# Patient Record
Sex: Female | Born: 1970
Health system: Southern US, Community
[De-identification: ages and names within clinical notes are randomized; demographics above are authoritative.]

## PROBLEM LIST (undated history)

## (undated) DIAGNOSIS — E669 Obesity, unspecified: Secondary | ICD-10-CM

## (undated) DIAGNOSIS — H269 Unspecified cataract: Secondary | ICD-10-CM

## (undated) DIAGNOSIS — C499 Malignant neoplasm of connective and soft tissue, unspecified: Secondary | ICD-10-CM

## (undated) HISTORY — DX: Unspecified cataract: H26.9

## (undated) HISTORY — PX: TONSILLECTOMY: SHX5217

## (undated) HISTORY — DX: Malignant neoplasm of connective and soft tissue, unspecified: C49.9

## (undated) HISTORY — PX: COLONOSCOPY: SHX174

## (undated) HISTORY — PX: ENDOMETRIAL ABLATION W/ NOVASURE: SUR434

## (undated) HISTORY — PX: COMBINED HYSTEROSCOPY DIAGNOSTIC / D&C: SUR297

## (undated) HISTORY — DX: Obesity, unspecified: E66.9

## (undated) HISTORY — PX: RADICAL ABDOMINAL HYSTERECTOMY: SUR659

---

## 2008-10-22 HISTORY — PX: AXILLARY ABCESS IRRIGATION AND DEBRIDEMENT: SHX1210

## 2019-03-20 DIAGNOSIS — Z6841 Body Mass Index (BMI) 40.0 and over, adult: Secondary | ICD-10-CM | POA: Insufficient documentation

## 2020-07-18 DIAGNOSIS — R1900 Intra-abdominal and pelvic swelling, mass and lump, unspecified site: Secondary | ICD-10-CM | POA: Insufficient documentation

## 2020-09-03 DIAGNOSIS — C549 Malignant neoplasm of corpus uteri, unspecified: Secondary | ICD-10-CM | POA: Insufficient documentation

## 2020-11-13 ENCOUNTER — Telehealth: Payer: Self-pay | Admitting: *Deleted

## 2020-11-13 NOTE — Telephone Encounter (Signed)
Late entry----called the patient yesterday to schedule a new patient appt. Patient requested an afternoon appt. Dr Berline Lopes ok to schedule in the afternoon.   Called the patient and left a message to call the office back for an appt

## 2020-11-26 ENCOUNTER — Encounter: Payer: Self-pay | Admitting: Gynecologic Oncology

## 2020-11-28 ENCOUNTER — Inpatient Hospital Stay: Payer: Self-pay | Admitting: Gynecologic Oncology

## 2020-12-05 ENCOUNTER — Encounter: Payer: Self-pay | Admitting: Gynecologic Oncology

## 2020-12-08 ENCOUNTER — Encounter: Payer: Self-pay | Admitting: Gynecologic Oncology

## 2020-12-08 ENCOUNTER — Other Ambulatory Visit: Payer: Self-pay

## 2020-12-08 ENCOUNTER — Inpatient Hospital Stay: Payer: 59 | Attending: Gynecologic Oncology | Admitting: Gynecologic Oncology

## 2020-12-08 VITALS — BP 174/97 | HR 85 | Temp 98.5°F | Resp 18 | Ht 66.0 in | Wt 238.0 lb

## 2020-12-08 DIAGNOSIS — Z90722 Acquired absence of ovaries, bilateral: Secondary | ICD-10-CM | POA: Diagnosis not present

## 2020-12-08 DIAGNOSIS — Z6838 Body mass index (BMI) 38.0-38.9, adult: Secondary | ICD-10-CM | POA: Diagnosis not present

## 2020-12-08 DIAGNOSIS — C55 Malignant neoplasm of uterus, part unspecified: Secondary | ICD-10-CM | POA: Insufficient documentation

## 2020-12-08 DIAGNOSIS — R911 Solitary pulmonary nodule: Secondary | ICD-10-CM | POA: Insufficient documentation

## 2020-12-08 DIAGNOSIS — A630 Anogenital (venereal) warts: Secondary | ICD-10-CM | POA: Diagnosis not present

## 2020-12-08 DIAGNOSIS — C499 Malignant neoplasm of connective and soft tissue, unspecified: Secondary | ICD-10-CM

## 2020-12-08 DIAGNOSIS — Z90710 Acquired absence of both cervix and uterus: Secondary | ICD-10-CM | POA: Diagnosis not present

## 2020-12-08 DIAGNOSIS — E669 Obesity, unspecified: Secondary | ICD-10-CM | POA: Diagnosis not present

## 2020-12-08 NOTE — Patient Instructions (Signed)
It was a pleasure to meet you today!  Please give Tina Decker a call when she know you are scheduled for August.  We will get your CT scan scheduled on a day that you do not have to work.  I will see you back for a checkup in the office in 3 months.  If you develop any new or concerning symptoms before then, please call the clinic at [phone] to see me sooner.

## 2020-12-08 NOTE — Progress Notes (Signed)
GYNECOLOGIC ONCOLOGY NEW PATIENT CONSULTATION   Patient Name: Tina Decker  Patient Age: 50 y.o. Date of Service: 12/08/20 Referring Provider: Dr. 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  The patient is doing very well and is NED on exam today. We reviewed signs and symptoms that would be concerning for disease recurrence that should prompt a phone call prior to next scheduled visit.   Per NCCN surveillance recommendations, we discussed continuing with visits every 3-4 months for 2-3 years. Given high risk histology, I recommend CT surveillance imaging. Additionally, the patient had a small pulmonary nodule on her post-op CT chest that requires follow-up. We will plan to get a CT C/A/P in August. The patient will call the office once she knows her August work schedule so that we can facilitate getting her imaging scheduled.   A copy of this note was sent to the patient's referring provider.   60 minutes of total time was spent for this patient encounter, including preparation, face-to-face counseling with the patient and coordination of care, and documentation of the encounter.  Jeral Pinch, MD  Division of Gynecologic Oncology  Department of Obstetrics and Gynecology  [facility]  ___________________________________________  Chief Complaint: Chief Complaint  Patient presents with   Leiomyosarcoma [facility])    History of Present Illness:  Tina Decker is a 50 y.o. y.o. female who is seen in consultation at the request of Dr. Zella Richer for an evaluation of continuation of care in the setting of recently treated leiomyosarcoma of the uterus.  Patient's cancer history is noted below.  She is status post definitive surgery  for stage Ib leiomyosarcoma of the uterus at the end of March this year.  She recovered well from surgery.  Since her last visit with her GYN oncologist in [place] in mid May, patient reports doing well.  She denies any vaginal bleeding or discharge.  She endorses a good appetite without nausea or emesis.  She has put on a little bit of the weight that she lost after her surgery.  She reports normal bowel and bladder function.  She denies any shortness of breath, cough, or chest pain.  Patient moved from [place] to the area because daughter is starting college for forensic pathology at [employer].  The patient will be working at [employer] in the [place].  Treatment History: Oncology History Overview Note  Endometrial ablation in 2017 - no bleeding after until she presented for annual exam in early 2022 and reported episode of heavy vaginal bleeding after COVID booster.  Pap 2022: LSIL, HR HPV+   Leiomyosarcoma of body of uterus ([place])  07/11/2020 Imaging   MRI pelvis: large heterogeneously solid mass measuring 9.7 x 8.1 x 8 cm causing compression to the vagina and cervical effacement. It was described as mostly reflecting the large fibroid. The mass was felt to be emanating from the lower segment ofthe uterus.    08/18/2020 Surgery   Ex lap, modified radical total abdominal hysterectomy, BSO, ureterolysis, lysis of adhesions, and cystoscopy. Dr. Radene Ou, PA  Findings: Upon examination under anesthesia, the external genitalia was normal. Upon bimanual examination, there was a large smooth mass that was filling the vagina to approximately 4 cm from the introitus. It was a smooth surface, approximately 10 cm in size. The  cervix was unable to be palpated secondary to the mass. Upon exploratory laparotomy, the omentum appeared grossly normal. The appendix appeared grossly normal. The bowel evaluated from ileocecal valve to the ligament of Treitz and normal. There were  no peritoneal mesenteric implants. On pelvic survey, the uterine fundus was normal in size. However, there was a very large expansile mass that was emanating from the lower uterine segment, extended into bilateral parametria, sidewall free upon dissection appeared consistent with fibroid. Bilateral fallopian tubes and ovaries were grossly normal. Anterior and posterior cul-de-sac were normal, although they were obliterated by the mass. The specimen was removed en bloc. Photographs were taken. Upon cystoscopic evaluation after closure of the cuff, there was brisk efflux of urine from each ureteral orifice and there was no bladderpathology or injury noted.     08/18/2020 Pathology Results   Stage IB leiomyosarcoma of the uterus Tumor board recommendations: surveillance, CT chest   08/18/2020 Initial Diagnosis   Leiomyosarcoma of body of uterus ([place])   09/10/2020 Imaging   CT chest: Solitary 3 mm right upper lobe pulmonary nodule. Consider follow-up CT chest in 6 months for reassessment.    PAST MEDICAL HISTORY:  Past Medical History:  Diagnosis Date   Cataract    Leiomyosarcoma ([place])    Obesity (BMI 30-39.9)      PAST SURGICAL HISTORY:  Past Surgical History:  Procedure Laterality Date   AXILLARY ABCESS IRRIGATION AND DEBRIDEMENT Left 10/2008   COMBINED HYSTEROSCOPY DIAGNOSTIC / D&C     ENDOMETRIAL ABLATION W/ NOVASURE     RADICAL ABDOMINAL HYSTERECTOMY     TONSILLECTOMY      OB/GYN HISTORY:  OB History  Gravida Para Term Preterm AB Living  1 1          SAB IAB Ectopic Multiple Live Births               # Outcome Date GA Lbr Len/2nd Weight Sex Delivery Anes PTL Lv  1 Para             No LMP recorded.  Age at menarche: 61 Age at menopause: 16 Hx of HRT: Denies Hx of STDs: Denies Last pap: 2022 - LSIL History of abnormal pap smears: Only most recent prior to her cancer diagnosis  SCREENING STUDIES:  Last mammogram: 2022  Last colonoscopy: n/a Last bone mineral  density: n/a  MEDICATIONS: Outpatient Encounter Medications as of 12/08/2020  Medication Sig   loratadine (CLARITIN) 10 MG tablet Take 10 mg by mouth as needed for allergies.   Multiple Vitamin (MULTIVITAMIN) tablet Take 1 tablet by mouth daily.   Turmeric 500 MG CAPS Take 2 capsules by mouth daily.   No facility-administered encounter medications on file as of 12/08/2020.    ALLERGIES:  No Known Allergies   FAMILY HISTORY:  Family History  Problem Relation Age of Onset   Breast cancer Mother    Atrial fibrillation Father    Hyperlipidemia Father      SOCIAL HISTORY:  Social Connections: Not on file    REVIEW OF SYSTEMS:  Pertinent positives include anxiety and depression Denies appetite changes, fevers, chills, fatigue, unexplained weight changes. Denies hearing loss, neck lumps or masses, mouth sores, ringing in ears or voice changes. Denies cough or wheezing.  Denies shortness of breath. Denies chest pain or palpitations. Denies leg swelling. Denies abdominal distention, pain, blood in stools, constipation, diarrhea, nausea, vomiting, or early satiety. Denies pain with intercourse, dysuria, frequency, hematuria or incontinence. Denies hot flashes, pelvic pain,  vaginal bleeding or vaginal discharge.   Denies joint pain, back pain or muscle pain/cramps. Denies itching, rash, or wounds. Denies dizziness, headaches, numbness or seizures. Denies swollen lymph nodes or glands, denies easy bruising or bleeding. Denies confusion, or decreased concentration.  Physical Exam:  Vital Signs for this encounter:  Blood pressure (!) 174/97, pulse 85, temperature 98.5 F (36.9 C), temperature source Tympanic, resp. rate 18, height 5\' 6"  (1.676 m), weight 238 lb (108 kg), SpO2 100 %. Body mass index is 38.41 kg/m. General: Alert, oriented, no acute distress.  HEENT: Normocephalic, atraumatic. Sclera anicteric.  Chest: Clear to auscultation bilaterally. No wheezes, rhonchi, or  rales. Cardiovascular: Regular rate and rhythm, no murmurs, rubs, or gallops.  Abdomen: Obese. Normoactive bowel sounds. Soft, nondistended, nontender to palpation. No masses or hepatosplenomegaly appreciated. No palpable fluid wave.  Well-healed midline laparotomy incision. Extremities: Grossly normal range of motion. Warm, well perfused. No edema bilaterally.  Skin: No rashes or lesions.  Lymphatics: No cervical, supraclavicular, or inguinal adenopathy.  GU:  Normal external female genitalia. No lesions. No discharge or bleeding.             Bladder/urethra:  No lesions or masses, well supported bladder             Vagina: well rugated, no lesions or masses.             Cervix/uterus: Surgically absent.             Adnexa: No masses appreciated.  Rectal: No masses or nodularity.  LABORATORY AND RADIOLOGIC DATA:  Outside medical records were reviewed to synthesize the above history, along with the history and physical obtained during the visit.   No results found for: WBC, HGB, HCT, PLT, GLUCOSE, CHOL, TRIG, HDL, LDLDIRECT, LDLCALC, ALT, AST, NA, K, CL, CREATININE, BUN, CO2, TSH, PSA, INR, GLUF, HGBA1C, MICROALBUR

## 2020-12-22 ENCOUNTER — Ambulatory Visit
Admission: RE | Admit: 2020-12-22 | Discharge: 2020-12-22 | Disposition: A | Discharge status: Home / Self Care | Payer: Self-pay | Source: Ambulatory Visit | Attending: Gynecologic Oncology | Admitting: Gynecologic Oncology

## 2020-12-22 ENCOUNTER — Encounter: Payer: Self-pay | Admitting: Oncology

## 2020-12-22 DIAGNOSIS — C549 Malignant neoplasm of corpus uteri, unspecified: Secondary | ICD-10-CM

## 2020-12-22 NOTE — Progress Notes (Signed)
Imaging CD from [facility] brought to [facility] to be uploaded.

## 2020-12-30 ENCOUNTER — Telehealth: Payer: Self-pay

## 2020-12-30 NOTE — Telephone Encounter (Signed)
Spoke with [facility] and notified her of her appointment on 8/17 at 8am for her CT scan. Instructed her that she needs to be NPO 4 hours prior. She will drink her first bottle of contrast at 6am and her second bottle at 7am. She will coming by the cancer center Thursday 8/11 to pick up the contrast. Patient verbalized understanding.

## 2021-01-07 ENCOUNTER — Encounter (HOSPITAL_COMMUNITY): Payer: Self-pay

## 2021-01-07 ENCOUNTER — Ambulatory Visit (HOSPITAL_COMMUNITY)
Admission: RE | Admit: 2021-01-07 | Discharge: 2021-01-07 | Disposition: A | Discharge status: Home / Self Care | Payer: 59 | Source: Ambulatory Visit | Attending: Gynecologic Oncology | Admitting: Gynecologic Oncology

## 2021-01-07 ENCOUNTER — Other Ambulatory Visit: Payer: Self-pay

## 2021-01-07 DIAGNOSIS — I700 Atherosclerosis of aorta: Secondary | ICD-10-CM | POA: Diagnosis not present

## 2021-01-07 DIAGNOSIS — R918 Other nonspecific abnormal finding of lung field: Secondary | ICD-10-CM | POA: Diagnosis not present

## 2021-01-07 DIAGNOSIS — R911 Solitary pulmonary nodule: Secondary | ICD-10-CM | POA: Diagnosis not present

## 2021-01-07 DIAGNOSIS — C55 Malignant neoplasm of uterus, part unspecified: Secondary | ICD-10-CM | POA: Diagnosis not present

## 2021-01-07 DIAGNOSIS — C499 Malignant neoplasm of connective and soft tissue, unspecified: Secondary | ICD-10-CM | POA: Diagnosis not present

## 2021-01-07 DIAGNOSIS — C539 Malignant neoplasm of cervix uteri, unspecified: Secondary | ICD-10-CM | POA: Diagnosis not present

## 2021-01-07 MED ORDER — IOHEXOL 350 MG/ML SOLN
100.0000 mL | Freq: Once | INTRAVENOUS | Status: AC | PRN
  Administered 2021-01-07: 75 mL via INTRAVENOUS

## 2021-02-09 ENCOUNTER — Telehealth: Payer: Self-pay | Admitting: *Deleted

## 2021-02-09 NOTE — Telephone Encounter (Signed)
Called and moved the patient's appt from 10/21 to 10/24

## 2021-02-10 DIAGNOSIS — Z0000 Encounter for general adult medical examination without abnormal findings: Secondary | ICD-10-CM | POA: Diagnosis not present

## 2021-02-10 DIAGNOSIS — R7309 Other abnormal glucose: Secondary | ICD-10-CM | POA: Diagnosis not present

## 2021-02-10 DIAGNOSIS — I700 Atherosclerosis of aorta: Secondary | ICD-10-CM | POA: Diagnosis not present

## 2021-02-10 DIAGNOSIS — Z13220 Encounter for screening for lipoid disorders: Secondary | ICD-10-CM | POA: Diagnosis not present

## 2021-02-10 DIAGNOSIS — I10 Essential (primary) hypertension: Secondary | ICD-10-CM | POA: Diagnosis not present

## 2021-02-12 DIAGNOSIS — M7918 Myalgia, other site: Secondary | ICD-10-CM | POA: Diagnosis not present

## 2021-02-12 DIAGNOSIS — M9906 Segmental and somatic dysfunction of lower extremity: Secondary | ICD-10-CM | POA: Diagnosis not present

## 2021-02-12 DIAGNOSIS — M546 Pain in thoracic spine: Secondary | ICD-10-CM | POA: Diagnosis not present

## 2021-02-12 DIAGNOSIS — M9902 Segmental and somatic dysfunction of thoracic region: Secondary | ICD-10-CM | POA: Diagnosis not present

## 2021-02-12 DIAGNOSIS — M542 Cervicalgia: Secondary | ICD-10-CM | POA: Diagnosis not present

## 2021-02-12 DIAGNOSIS — M9904 Segmental and somatic dysfunction of sacral region: Secondary | ICD-10-CM | POA: Diagnosis not present

## 2021-02-12 DIAGNOSIS — M9901 Segmental and somatic dysfunction of cervical region: Secondary | ICD-10-CM | POA: Diagnosis not present

## 2021-02-12 DIAGNOSIS — M9903 Segmental and somatic dysfunction of lumbar region: Secondary | ICD-10-CM | POA: Diagnosis not present

## 2021-02-12 DIAGNOSIS — M9905 Segmental and somatic dysfunction of pelvic region: Secondary | ICD-10-CM | POA: Diagnosis not present

## 2021-02-16 ENCOUNTER — Telehealth: Payer: Self-pay | Admitting: *Deleted

## 2021-02-16 NOTE — Telephone Encounter (Signed)
Per request fax records to [facility]

## 2021-02-17 DIAGNOSIS — M9902 Segmental and somatic dysfunction of thoracic region: Secondary | ICD-10-CM | POA: Diagnosis not present

## 2021-02-17 DIAGNOSIS — M9901 Segmental and somatic dysfunction of cervical region: Secondary | ICD-10-CM | POA: Diagnosis not present

## 2021-02-17 DIAGNOSIS — M7918 Myalgia, other site: Secondary | ICD-10-CM | POA: Diagnosis not present

## 2021-02-17 DIAGNOSIS — M546 Pain in thoracic spine: Secondary | ICD-10-CM | POA: Diagnosis not present

## 2021-02-17 DIAGNOSIS — M9904 Segmental and somatic dysfunction of sacral region: Secondary | ICD-10-CM | POA: Diagnosis not present

## 2021-02-17 DIAGNOSIS — M9906 Segmental and somatic dysfunction of lower extremity: Secondary | ICD-10-CM | POA: Diagnosis not present

## 2021-02-17 DIAGNOSIS — M9903 Segmental and somatic dysfunction of lumbar region: Secondary | ICD-10-CM | POA: Diagnosis not present

## 2021-02-17 DIAGNOSIS — M542 Cervicalgia: Secondary | ICD-10-CM | POA: Diagnosis not present

## 2021-02-17 DIAGNOSIS — M9905 Segmental and somatic dysfunction of pelvic region: Secondary | ICD-10-CM | POA: Diagnosis not present

## 2021-02-26 DIAGNOSIS — Z1211 Encounter for screening for malignant neoplasm of colon: Secondary | ICD-10-CM | POA: Diagnosis not present

## 2021-02-26 DIAGNOSIS — E782 Mixed hyperlipidemia: Secondary | ICD-10-CM | POA: Diagnosis not present

## 2021-02-26 DIAGNOSIS — Z0000 Encounter for general adult medical examination without abnormal findings: Secondary | ICD-10-CM | POA: Diagnosis not present

## 2021-02-26 DIAGNOSIS — I700 Atherosclerosis of aorta: Secondary | ICD-10-CM | POA: Diagnosis not present

## 2021-02-26 DIAGNOSIS — C55 Malignant neoplasm of uterus, part unspecified: Secondary | ICD-10-CM | POA: Diagnosis not present

## 2021-02-26 DIAGNOSIS — R911 Solitary pulmonary nodule: Secondary | ICD-10-CM | POA: Diagnosis not present

## 2021-02-26 DIAGNOSIS — I10 Essential (primary) hypertension: Secondary | ICD-10-CM | POA: Diagnosis not present

## 2021-03-03 DIAGNOSIS — M50323 Other cervical disc degeneration at C6-C7 level: Secondary | ICD-10-CM | POA: Diagnosis not present

## 2021-03-03 DIAGNOSIS — M5137 Other intervertebral disc degeneration, lumbosacral region: Secondary | ICD-10-CM | POA: Diagnosis not present

## 2021-03-03 DIAGNOSIS — M9901 Segmental and somatic dysfunction of cervical region: Secondary | ICD-10-CM | POA: Diagnosis not present

## 2021-03-03 DIAGNOSIS — M9903 Segmental and somatic dysfunction of lumbar region: Secondary | ICD-10-CM | POA: Diagnosis not present

## 2021-03-06 ENCOUNTER — Ambulatory Visit: Payer: 59 | Admitting: Gynecologic Oncology

## 2021-03-09 DIAGNOSIS — M47812 Spondylosis without myelopathy or radiculopathy, cervical region: Secondary | ICD-10-CM | POA: Diagnosis not present

## 2021-03-13 ENCOUNTER — Ambulatory Visit: Payer: 59 | Admitting: Gynecologic Oncology

## 2021-03-13 ENCOUNTER — Encounter: Payer: Self-pay | Admitting: Gynecologic Oncology

## 2021-03-16 ENCOUNTER — Other Ambulatory Visit: Payer: Self-pay

## 2021-03-16 ENCOUNTER — Encounter: Payer: Self-pay | Admitting: Gynecologic Oncology

## 2021-03-16 ENCOUNTER — Inpatient Hospital Stay: Payer: 59 | Attending: Gynecologic Oncology | Admitting: Gynecologic Oncology

## 2021-03-16 VITALS — BP 140/85 | HR 76 | Temp 98.1°F | Resp 16 | Ht 66.0 in | Wt 248.0 lb

## 2021-03-16 DIAGNOSIS — C549 Malignant neoplasm of corpus uteri, unspecified: Secondary | ICD-10-CM | POA: Insufficient documentation

## 2021-03-16 DIAGNOSIS — M9901 Segmental and somatic dysfunction of cervical region: Secondary | ICD-10-CM | POA: Diagnosis not present

## 2021-03-16 DIAGNOSIS — C499 Malignant neoplasm of connective and soft tissue, unspecified: Secondary | ICD-10-CM

## 2021-03-16 DIAGNOSIS — M9903 Segmental and somatic dysfunction of lumbar region: Secondary | ICD-10-CM | POA: Diagnosis not present

## 2021-03-16 DIAGNOSIS — Z90710 Acquired absence of both cervix and uterus: Secondary | ICD-10-CM | POA: Insufficient documentation

## 2021-03-16 DIAGNOSIS — M5137 Other intervertebral disc degeneration, lumbosacral region: Secondary | ICD-10-CM | POA: Diagnosis not present

## 2021-03-16 DIAGNOSIS — Z90722 Acquired absence of ovaries, bilateral: Secondary | ICD-10-CM | POA: Diagnosis not present

## 2021-03-16 DIAGNOSIS — M50323 Other cervical disc degeneration at C6-C7 level: Secondary | ICD-10-CM | POA: Diagnosis not present

## 2021-03-16 NOTE — Patient Instructions (Signed)
It was good to see you today!  I do not see or feel any evidence of cancer recurrence on your exam.  We will plan for a follow-up visit in approximately 3 months, at the end of January or early February.  We will plan to also repeat a CT scan on the same day.  Since my schedule is not out past the end of the year, please call back in January to get a visit scheduled with me.  I have asked Sharyn Lull to get your CT scheduled on a Monday either in late January or early February.  If this ends up being on a day that you after work, please call when you know your schedule and we will get the date rearranged.  As always, if you develop any concerning symptoms, such as vaginal bleeding, pelvic pain, or change in your bowel function, before your next scheduled visit, please call to see me sooner.

## 2021-03-16 NOTE — Progress Notes (Signed)
Gynecologic Oncology Return Clinic Visit  03/16/21  Reason for Visit: surveillance visit in the setting of LMS  Treatment History: Oncology History Overview Note  Endometrial ablation in 2017 - no bleeding after until she presented for annual exam in early 2022 and reported episode of heavy vaginal bleeding after COVID booster.  Pap 2022: LSIL, HR HPV+   Leiomyosarcoma of body of uterus ([place])  07/11/2020 Imaging   MRI pelvis: large heterogeneously solid mass measuring 9.7 x 8.1 x 8 cm causing compression to the vagina and cervical effacement. It was described as mostly reflecting the large fibroid. The mass was felt to be emanating from the lower segment ofthe uterus.    08/18/2020 Surgery   Ex lap, modified radical total abdominal hysterectomy, BSO, ureterolysis, lysis of adhesions, and cystoscopy. Dr. Radene Ou, PA  Findings: Upon examination under anesthesia, the external genitalia was normal. Upon bimanual examination, there was a large smooth mass that was filling the vagina to approximately 4 cm from the introitus. It was a smooth surface, approximately 10 cm in size. The cervix was unable to be palpated secondary to the mass. Upon exploratory laparotomy, the omentum appeared grossly normal. The appendix appeared grossly normal. The bowel evaluated from ileocecal valve to the ligament of Treitz and normal. There were no peritoneal mesenteric implants. On pelvic survey, the uterine fundus was normal in size. However, there was a very large expansile mass that was emanating from the lower uterine segment, extended into bilateral parametria, sidewall free upon dissection appeared consistent with fibroid. Bilateral fallopian tubes and ovaries were grossly normal. Anterior and posterior cul-de-sac were normal, although they were obliterated by the mass. The specimen was removed en bloc. Photographs were taken. Upon cystoscopic evaluation after closure of the cuff, there  was brisk efflux of urine from each ureteral orifice and there was no bladderpathology or injury noted.     08/18/2020 Pathology Results   Stage IB leiomyosarcoma of the uterus Tumor board recommendations: surveillance, CT chest   08/18/2020 Initial Diagnosis   Leiomyosarcoma of body of uterus ([place])   09/10/2020 Imaging   CT chest: Solitary 3 mm right upper lobe pulmonary nodule. Consider follow-up CT chest in 6 months for reassessment.     Interval History: The patient presents today for follow-up.  I last saw her in July.  She is now settled in at her job at [employer] in the [place].  She is overall doing very well.  She denies any vaginal bleeding or discharge.  She endorses a good appetite without nausea or emesis.  She notes normal bowel and bladder function.  She denies any abdominal or pelvic pain.  Traveled to [place] this summer.  Past Medical/Surgical History: Past Medical History:  Diagnosis Date   Cataract    Leiomyosarcoma ([place])    Obesity (BMI 30-39.9)     Past Surgical History:  Procedure Laterality Date   AXILLARY ABCESS IRRIGATION AND DEBRIDEMENT Left 10/2008   COMBINED HYSTEROSCOPY DIAGNOSTIC / D&C     ENDOMETRIAL ABLATION W/ NOVASURE     RADICAL ABDOMINAL HYSTERECTOMY     TONSILLECTOMY      Family History  Problem Relation Age of Onset   Breast cancer Mother    Atrial fibrillation Father    Hyperlipidemia Father     Social History   Socioeconomic History   Marital status: Single    Spouse name: Not on file   Number of children: Not on file   Years of education: Not  on file   Highest education level: Not on file  Occupational History   Occupation: OR Nurse    Employer: [employer]  Tobacco Use   Smoking status: Never   Smokeless tobacco: Never  Vaping Use   Vaping Use: Never used  Substance and Sexual Activity   Alcohol use: Yes    Comment: Ocassional   Drug use: Never   Sexual activity: Not Currently  Other Topics Concern   Not on  file  Social History Narrative   Not on file   Social Determinants of Health   Financial Resource Strain: Not on file  Food Insecurity: Not on file  Transportation Needs: Not on file  Physical Activity: Not on file  Stress: Not on file  Social Connections: Not on file    Current Medications:  Current Outpatient Medications:    loratadine (CLARITIN) 10 MG tablet, Take 10 mg by mouth as needed for allergies., Disp: , Rfl:    losartan (COZAAR) 50 MG tablet, Take 50 mg by mouth daily., Disp: , Rfl:    Multiple Vitamin (MULTIVITAMIN) tablet, Take 1 tablet by mouth daily., Disp: , Rfl:    Turmeric 500 MG CAPS, Take 2 capsules by mouth daily., Disp: , Rfl:    fluticasone (FLONASE) 50 MCG/ACT nasal spray, Place 2 sprays into both nostrils 2 (two) times daily as needed. (Patient not taking: Reported on 03/13/2021), Disp: , Rfl:   Review of Systems: Denies appetite changes, fevers, chills, fatigue, unexplained weight changes. Denies hearing loss, neck lumps or masses, mouth sores, ringing in ears or voice changes. Denies cough or wheezing.  Denies shortness of breath. Denies chest pain or palpitations. Denies leg swelling. Denies abdominal distention, pain, blood in stools, constipation, diarrhea, nausea, vomiting, or early satiety. Denies pain with intercourse, dysuria, frequency, hematuria or incontinence. Denies hot flashes, pelvic pain, vaginal bleeding or vaginal discharge.   Denies joint pain, back pain or muscle pain/cramps. Denies itching, rash, or wounds. Denies dizziness, headaches, numbness or seizures. Denies swollen lymph nodes or glands, denies easy bruising or bleeding. Denies anxiety, depression, confusion, or decreased concentration.  Physical Exam: BP 140/85 Comment (BP Location): manual  Pulse 76   Temp 98.1 F (36.7 C) (Oral)   Resp 16   Ht 5\' 6"  (1.676 m)   Wt 248 lb (112.5 kg)   SpO2 99%   BMI 40.03 kg/m  General: Alert, oriented, no acute distress. HEENT:  Normocephalic, atraumatic, sclera anicteric. Chest: Clear to auscultation bilaterally.  No wheezes or rhonchi. Cardiovascular: Regular rate and rhythm, no murmurs. Abdomen: Obese, soft, nontender.  Normoactive bowel sounds.  No masses or hepatosplenomegaly appreciated.  Well-healed incision. Extremities: Grossly normal range of motion.  Warm, well perfused.  No edema bilaterally. Skin: No rashes or lesions noted. Lymphatics: No cervical, supraclavicular, or inguinal adenopathy. GU: Normal appearing external genitalia without erythema, excoriation, or lesions.  Speculum exam reveals well rugated vaginal mucosa, no lesions or masses noted.  Bimanual exam reveals cuff intact, no nodularity or masses.  Rectovaginal exam confirms these findings.  Laboratory & Radiologic Studies: CT C/A/P on 8/17: IMPRESSION: 1. No definitive evidence of metastatic disease. 2. Scattered pulmonary nodules are unchanged from 09/10/2020. Continued attention on follow-up is recommended. 3.  Aortic atherosclerosis (ICD10-I70.0).  Assessment & Plan: Tina Decker is a 50 y.o. 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  Patient continues to do well and is NED on exam.  We reviewed follow-up  CT scan from August that showed no evidence of metastatic disease.  Few pulmonary nodule were stable from prior imaging in April.   Per NCCN surveillance recommendations, we discussed continuing with visits every 3-4 months for 2-3 years. Given high risk histology, I recommend CT surveillance imaging.  We will tentatively plan for CT and neck surveillance visit at the end of January or early February.  CT scan was scheduled today.  Patient will call back when she knows her work schedule closer to the date to change this if needed.  We reviewed signs and symptoms that would be concerning for disease recurrence, and the patient knows to call if  she develops any of these before her next scheduled visit.  32 minutes of total time was spent for this patient encounter, including preparation, face-to-face counseling with the patient and coordination of care, and documentation of the encounter.  Jeral Pinch, MD  Division of Gynecologic Oncology  Department of Obstetrics and Gynecology  [facility]

## 2021-03-18 DIAGNOSIS — M5137 Other intervertebral disc degeneration, lumbosacral region: Secondary | ICD-10-CM | POA: Diagnosis not present

## 2021-03-18 DIAGNOSIS — M50323 Other cervical disc degeneration at C6-C7 level: Secondary | ICD-10-CM | POA: Diagnosis not present

## 2021-03-18 DIAGNOSIS — M9901 Segmental and somatic dysfunction of cervical region: Secondary | ICD-10-CM | POA: Diagnosis not present

## 2021-03-18 DIAGNOSIS — M9903 Segmental and somatic dysfunction of lumbar region: Secondary | ICD-10-CM | POA: Diagnosis not present

## 2021-03-19 DIAGNOSIS — M9903 Segmental and somatic dysfunction of lumbar region: Secondary | ICD-10-CM | POA: Diagnosis not present

## 2021-03-19 DIAGNOSIS — M50323 Other cervical disc degeneration at C6-C7 level: Secondary | ICD-10-CM | POA: Diagnosis not present

## 2021-03-19 DIAGNOSIS — M5137 Other intervertebral disc degeneration, lumbosacral region: Secondary | ICD-10-CM | POA: Diagnosis not present

## 2021-03-19 DIAGNOSIS — M9901 Segmental and somatic dysfunction of cervical region: Secondary | ICD-10-CM | POA: Diagnosis not present

## 2021-03-20 ENCOUNTER — Telehealth: Payer: Self-pay | Admitting: *Deleted

## 2021-03-20 NOTE — Telephone Encounter (Signed)
Per Dr Berline Lopes scheduled the patient for a CT on 06/18/2021 at 8 am and MD visit on 06/22/2021 at 1 pm. Called and gave the patient the apt dates and times

## 2021-03-24 DIAGNOSIS — M50323 Other cervical disc degeneration at C6-C7 level: Secondary | ICD-10-CM | POA: Diagnosis not present

## 2021-03-24 DIAGNOSIS — M9901 Segmental and somatic dysfunction of cervical region: Secondary | ICD-10-CM | POA: Diagnosis not present

## 2021-03-24 DIAGNOSIS — M9903 Segmental and somatic dysfunction of lumbar region: Secondary | ICD-10-CM | POA: Diagnosis not present

## 2021-03-24 DIAGNOSIS — M5137 Other intervertebral disc degeneration, lumbosacral region: Secondary | ICD-10-CM | POA: Diagnosis not present

## 2021-03-25 DIAGNOSIS — M9903 Segmental and somatic dysfunction of lumbar region: Secondary | ICD-10-CM | POA: Diagnosis not present

## 2021-03-25 DIAGNOSIS — M9901 Segmental and somatic dysfunction of cervical region: Secondary | ICD-10-CM | POA: Diagnosis not present

## 2021-03-25 DIAGNOSIS — M5137 Other intervertebral disc degeneration, lumbosacral region: Secondary | ICD-10-CM | POA: Diagnosis not present

## 2021-03-25 DIAGNOSIS — M50323 Other cervical disc degeneration at C6-C7 level: Secondary | ICD-10-CM | POA: Diagnosis not present

## 2021-03-26 DIAGNOSIS — M50323 Other cervical disc degeneration at C6-C7 level: Secondary | ICD-10-CM | POA: Diagnosis not present

## 2021-03-26 DIAGNOSIS — M5137 Other intervertebral disc degeneration, lumbosacral region: Secondary | ICD-10-CM | POA: Diagnosis not present

## 2021-03-26 DIAGNOSIS — M9901 Segmental and somatic dysfunction of cervical region: Secondary | ICD-10-CM | POA: Diagnosis not present

## 2021-03-26 DIAGNOSIS — M9903 Segmental and somatic dysfunction of lumbar region: Secondary | ICD-10-CM | POA: Diagnosis not present

## 2021-03-30 DIAGNOSIS — M50323 Other cervical disc degeneration at C6-C7 level: Secondary | ICD-10-CM | POA: Diagnosis not present

## 2021-03-30 DIAGNOSIS — M9903 Segmental and somatic dysfunction of lumbar region: Secondary | ICD-10-CM | POA: Diagnosis not present

## 2021-03-30 DIAGNOSIS — M5137 Other intervertebral disc degeneration, lumbosacral region: Secondary | ICD-10-CM | POA: Diagnosis not present

## 2021-03-30 DIAGNOSIS — M9901 Segmental and somatic dysfunction of cervical region: Secondary | ICD-10-CM | POA: Diagnosis not present

## 2021-04-06 ENCOUNTER — Other Ambulatory Visit (HOSPITAL_COMMUNITY): Payer: Self-pay

## 2021-04-06 DIAGNOSIS — M50323 Other cervical disc degeneration at C6-C7 level: Secondary | ICD-10-CM | POA: Diagnosis not present

## 2021-04-06 DIAGNOSIS — M5137 Other intervertebral disc degeneration, lumbosacral region: Secondary | ICD-10-CM | POA: Diagnosis not present

## 2021-04-06 DIAGNOSIS — M9901 Segmental and somatic dysfunction of cervical region: Secondary | ICD-10-CM | POA: Diagnosis not present

## 2021-04-06 DIAGNOSIS — M9903 Segmental and somatic dysfunction of lumbar region: Secondary | ICD-10-CM | POA: Diagnosis not present

## 2021-04-06 MED ORDER — LOSARTAN POTASSIUM 50 MG PO TABS
50.0000 mg | ORAL_TABLET | Freq: Every day | ORAL | 2 refills | Status: DC
  Filled 2021-04-06: qty 90, 90d supply, fill #0

## 2021-04-08 DIAGNOSIS — M5137 Other intervertebral disc degeneration, lumbosacral region: Secondary | ICD-10-CM | POA: Diagnosis not present

## 2021-04-08 DIAGNOSIS — M50323 Other cervical disc degeneration at C6-C7 level: Secondary | ICD-10-CM | POA: Diagnosis not present

## 2021-04-08 DIAGNOSIS — M9901 Segmental and somatic dysfunction of cervical region: Secondary | ICD-10-CM | POA: Diagnosis not present

## 2021-04-08 DIAGNOSIS — M9903 Segmental and somatic dysfunction of lumbar region: Secondary | ICD-10-CM | POA: Diagnosis not present

## 2021-04-09 DIAGNOSIS — M50323 Other cervical disc degeneration at C6-C7 level: Secondary | ICD-10-CM | POA: Diagnosis not present

## 2021-04-09 DIAGNOSIS — M9901 Segmental and somatic dysfunction of cervical region: Secondary | ICD-10-CM | POA: Diagnosis not present

## 2021-04-09 DIAGNOSIS — M9903 Segmental and somatic dysfunction of lumbar region: Secondary | ICD-10-CM | POA: Diagnosis not present

## 2021-04-09 DIAGNOSIS — M5137 Other intervertebral disc degeneration, lumbosacral region: Secondary | ICD-10-CM | POA: Diagnosis not present

## 2021-04-13 DIAGNOSIS — M50323 Other cervical disc degeneration at C6-C7 level: Secondary | ICD-10-CM | POA: Diagnosis not present

## 2021-04-13 DIAGNOSIS — M5137 Other intervertebral disc degeneration, lumbosacral region: Secondary | ICD-10-CM | POA: Diagnosis not present

## 2021-04-13 DIAGNOSIS — M9901 Segmental and somatic dysfunction of cervical region: Secondary | ICD-10-CM | POA: Diagnosis not present

## 2021-04-13 DIAGNOSIS — M9903 Segmental and somatic dysfunction of lumbar region: Secondary | ICD-10-CM | POA: Diagnosis not present

## 2021-04-20 DIAGNOSIS — M5137 Other intervertebral disc degeneration, lumbosacral region: Secondary | ICD-10-CM | POA: Diagnosis not present

## 2021-04-20 DIAGNOSIS — M50323 Other cervical disc degeneration at C6-C7 level: Secondary | ICD-10-CM | POA: Diagnosis not present

## 2021-04-20 DIAGNOSIS — M9901 Segmental and somatic dysfunction of cervical region: Secondary | ICD-10-CM | POA: Diagnosis not present

## 2021-04-20 DIAGNOSIS — M9903 Segmental and somatic dysfunction of lumbar region: Secondary | ICD-10-CM | POA: Diagnosis not present

## 2021-04-30 DIAGNOSIS — M9903 Segmental and somatic dysfunction of lumbar region: Secondary | ICD-10-CM | POA: Diagnosis not present

## 2021-04-30 DIAGNOSIS — M50323 Other cervical disc degeneration at C6-C7 level: Secondary | ICD-10-CM | POA: Diagnosis not present

## 2021-04-30 DIAGNOSIS — M9901 Segmental and somatic dysfunction of cervical region: Secondary | ICD-10-CM | POA: Diagnosis not present

## 2021-04-30 DIAGNOSIS — M5137 Other intervertebral disc degeneration, lumbosacral region: Secondary | ICD-10-CM | POA: Diagnosis not present

## 2021-05-07 DIAGNOSIS — M5137 Other intervertebral disc degeneration, lumbosacral region: Secondary | ICD-10-CM | POA: Diagnosis not present

## 2021-05-07 DIAGNOSIS — M50323 Other cervical disc degeneration at C6-C7 level: Secondary | ICD-10-CM | POA: Diagnosis not present

## 2021-05-07 DIAGNOSIS — M9903 Segmental and somatic dysfunction of lumbar region: Secondary | ICD-10-CM | POA: Diagnosis not present

## 2021-05-07 DIAGNOSIS — M9901 Segmental and somatic dysfunction of cervical region: Secondary | ICD-10-CM | POA: Diagnosis not present

## 2021-05-14 DIAGNOSIS — M9903 Segmental and somatic dysfunction of lumbar region: Secondary | ICD-10-CM | POA: Diagnosis not present

## 2021-05-14 DIAGNOSIS — M5137 Other intervertebral disc degeneration, lumbosacral region: Secondary | ICD-10-CM | POA: Diagnosis not present

## 2021-05-14 DIAGNOSIS — M9901 Segmental and somatic dysfunction of cervical region: Secondary | ICD-10-CM | POA: Diagnosis not present

## 2021-05-14 DIAGNOSIS — M50323 Other cervical disc degeneration at C6-C7 level: Secondary | ICD-10-CM | POA: Diagnosis not present

## 2021-05-21 DIAGNOSIS — Z1211 Encounter for screening for malignant neoplasm of colon: Secondary | ICD-10-CM | POA: Diagnosis not present

## 2021-05-28 DIAGNOSIS — M50323 Other cervical disc degeneration at C6-C7 level: Secondary | ICD-10-CM | POA: Diagnosis not present

## 2021-05-28 DIAGNOSIS — M9901 Segmental and somatic dysfunction of cervical region: Secondary | ICD-10-CM | POA: Diagnosis not present

## 2021-05-28 DIAGNOSIS — M9903 Segmental and somatic dysfunction of lumbar region: Secondary | ICD-10-CM | POA: Diagnosis not present

## 2021-05-28 DIAGNOSIS — M5137 Other intervertebral disc degeneration, lumbosacral region: Secondary | ICD-10-CM | POA: Diagnosis not present

## 2021-06-18 ENCOUNTER — Ambulatory Visit (HOSPITAL_COMMUNITY)
Admission: RE | Admit: 2021-06-18 | Discharge: 2021-06-18 | Disposition: A | Discharge status: Home / Self Care | Payer: 59 | Source: Ambulatory Visit | Attending: Gynecologic Oncology | Admitting: Gynecologic Oncology

## 2021-06-18 ENCOUNTER — Other Ambulatory Visit: Payer: Self-pay

## 2021-06-18 ENCOUNTER — Encounter (HOSPITAL_COMMUNITY): Payer: Self-pay

## 2021-06-18 DIAGNOSIS — C499 Malignant neoplasm of connective and soft tissue, unspecified: Secondary | ICD-10-CM | POA: Insufficient documentation

## 2021-06-18 DIAGNOSIS — K6389 Other specified diseases of intestine: Secondary | ICD-10-CM | POA: Diagnosis not present

## 2021-06-18 DIAGNOSIS — I517 Cardiomegaly: Secondary | ICD-10-CM | POA: Diagnosis not present

## 2021-06-18 DIAGNOSIS — I700 Atherosclerosis of aorta: Secondary | ICD-10-CM | POA: Diagnosis not present

## 2021-06-18 DIAGNOSIS — R918 Other nonspecific abnormal finding of lung field: Secondary | ICD-10-CM | POA: Diagnosis not present

## 2021-06-18 DIAGNOSIS — K3189 Other diseases of stomach and duodenum: Secondary | ICD-10-CM | POA: Diagnosis not present

## 2021-06-18 DIAGNOSIS — Z8541 Personal history of malignant neoplasm of cervix uteri: Secondary | ICD-10-CM | POA: Diagnosis not present

## 2021-06-18 LAB — POCT I-STAT CREATININE: Creatinine, Ser: 0.8 mg/dL (ref 0.44–1.00)

## 2021-06-18 MED ORDER — IOHEXOL 300 MG/ML  SOLN
100.0000 mL | Freq: Once | INTRAMUSCULAR | Status: AC | PRN
  Administered 2021-06-18: 100 mL via INTRAVENOUS

## 2021-06-18 MED ORDER — SODIUM CHLORIDE (PF) 0.9 % IJ SOLN
INTRAMUSCULAR | Status: AC
  Filled 2021-06-18: qty 50

## 2021-06-21 NOTE — Patient Instructions (Signed)
It was good to see you today. I don't see or feel any evidence of cancer on your exam.  I will see you back in 3-4 months. Please call if you develop any symptoms that we discussed today that would be concerning for cancer recurrence.

## 2021-06-21 NOTE — Progress Notes (Signed)
Gynecologic Oncology Return Clinic Visit  06/22/21  Reason for Visit: surveillance visit in the setting of LMS  Treatment History: Oncology History Overview Note  Endometrial ablation in 2017 - no bleeding after until she presented for annual exam in early 2022 and reported episode of heavy vaginal bleeding after COVID booster.  Pap 2022: LSIL, HR HPV+   Leiomyosarcoma of body of uterus ([place])  07/11/2020 Imaging   MRI pelvis: large heterogeneously solid mass measuring 9.7 x 8.1 x 8 cm causing compression to the vagina and cervical effacement. It was described as mostly reflecting the large fibroid. The mass was felt to be emanating from the lower segment ofthe uterus.    08/18/2020 Surgery   Ex lap, modified radical total abdominal hysterectomy, BSO, ureterolysis, lysis of adhesions, and cystoscopy. Dr. Radene Ou, [place]  Findings: Upon examination under anesthesia, the external genitalia was normal. Upon bimanual examination, there was a large smooth mass that was filling the vagina to approximately 4 cm from the introitus. It was a smooth surface, approximately 10 cm in size. The cervix was unable to be palpated secondary to the mass. Upon exploratory laparotomy, the omentum appeared grossly normal. The appendix appeared grossly normal. The bowel evaluated from ileocecal valve to the ligament of Treitz and normal. There were no peritoneal mesenteric implants. On pelvic survey, the uterine fundus was normal in size. However, there was a very large expansile mass that was emanating from the lower uterine segment, extended into bilateral parametria, sidewall free upon dissection appeared consistent with fibroid. Bilateral fallopian tubes and ovaries were grossly normal. Anterior and posterior cul-de-sac were normal, although they were obliterated by the mass. The specimen was removed en bloc. Photographs were taken. Upon cystoscopic evaluation after closure of the cuff, there  was brisk efflux of urine from each ureteral orifice and there was no bladderpathology or injury noted.     08/18/2020 Pathology Results   Stage IB leiomyosarcoma of the uterus Tumor board recommendations: surveillance, CT chest   08/18/2020 Initial Diagnosis   Leiomyosarcoma of body of uterus ([place])   09/10/2020 Imaging   CT chest: Solitary 3 mm right upper lobe pulmonary nodule. Consider follow-up CT chest in 6 months for reassessment.     Interval History: Reports doing well.  Denies any vaginal bleeding, pelvic pain, or discharge.  Reports regular bowel and bladder function.  Endorses good appetite without nausea or emesis.  Past Medical/Surgical History: Past Medical History:  Diagnosis Date   Cataract    Leiomyosarcoma ([place])    Obesity (BMI 30-39.9)     Past Surgical History:  Procedure Laterality Date   AXILLARY ABCESS IRRIGATION AND DEBRIDEMENT Left 10/2008   COMBINED HYSTEROSCOPY DIAGNOSTIC / D&C     ENDOMETRIAL ABLATION W/ NOVASURE     RADICAL ABDOMINAL HYSTERECTOMY     TONSILLECTOMY      Family History  Problem Relation Age of Onset   Breast cancer Mother    Atrial fibrillation Father    Hyperlipidemia Father     Social History   Socioeconomic History   Marital status: Single    Spouse name: Not on file   Number of children: Not on file   Years of education: Not on file   Highest education level: Not on file  Occupational History   Occupation: OR Nurse    Employer: [employer]  Tobacco Use   Smoking status: Never   Smokeless tobacco: Never  Vaping Use   Vaping Use: Never used  Substance and Sexual  Activity   Alcohol use: Yes    Comment: Ocassional   Drug use: Never   Sexual activity: Not Currently  Other Topics Concern   Not on file  Social History Narrative   Not on file   Social Determinants of Health   Financial Resource Strain: Not on file  Food Insecurity: Not on file  Transportation Needs: Not on file  Physical Activity: Not on  file  Stress: Not on file  Social Connections: Not on file    Current Medications:  Current Outpatient Medications:    losartan (COZAAR) 50 MG tablet, Take 1 tablet (50 mg total) by mouth daily., Disp: 90 tablet, Rfl: 2   Multiple Vitamin (MULTIVITAMIN) tablet, Take 1 tablet by mouth daily., Disp: , Rfl:    fluticasone (FLONASE) 50 MCG/ACT nasal spray, Place 2 sprays into both nostrils 2 (two) times daily as needed. (Patient not taking: Reported on 03/13/2021), Disp: , Rfl:    loratadine (CLARITIN) 10 MG tablet, Take 10 mg by mouth as needed for allergies. (Patient not taking: Reported on 06/22/2021), Disp: , Rfl:    Turmeric 500 MG CAPS, Take 2 capsules by mouth daily. (Patient not taking: Reported on 06/22/2021), Disp: , Rfl:   Review of Systems: Denies appetite changes, fevers, chills, fatigue, unexplained weight changes. Denies hearing loss, neck lumps or masses, mouth sores, ringing in ears or voice changes. Denies cough or wheezing.  Denies shortness of breath. Denies chest pain or palpitations. Denies leg swelling. Denies abdominal distention, pain, blood in stools, constipation, diarrhea, nausea, vomiting, or early satiety. Denies pain with intercourse, dysuria, frequency, hematuria or incontinence. Denies hot flashes, pelvic pain, vaginal bleeding or vaginal discharge.   Denies joint pain, back pain or muscle pain/cramps. Denies itching, rash, or wounds. Denies dizziness, headaches, numbness or seizures. Denies swollen lymph nodes or glands, denies easy bruising or bleeding. Denies anxiety, depression, confusion, or decreased concentration.  Physical Exam: BP 133/82 (BP Location: Left Arm, Patient Position: Sitting)    Pulse 84    Temp 98.8 F (37.1 C) (Oral)    Resp 16    Ht 5\' 6"  (1.676 m)    Wt 260 lb (117.9 kg)    SpO2 98%    BMI 41.97 kg/m  General: Alert, oriented, no acute distress. HEENT: Normocephalic, atraumatic, sclera anicteric. Chest: Clear to auscultation  bilaterally.  No wheezes or rhonchi. Cardiovascular: Regular rate and rhythm, no murmurs. Abdomen: Obese, soft, nontender.  Normoactive bowel sounds.  No masses or hepatosplenomegaly appreciated.  Well-healed midline incision. Extremities: Grossly normal range of motion.  Warm, well perfused.  No edema bilaterally. Skin: No rashes or lesions noted. Lymphatics: No cervical, supraclavicular, or inguinal adenopathy. GU: Normal appearing external genitalia without erythema, excoriation, or lesions.  Speculum exam reveals well rugated vaginal mucosa, no lesions or masses noted.  No blood or discharge within the vault.  Bimanual exam reveals cuff intact, no nodularity or masses.  Rectovaginal exam confirms these findings.  Laboratory & Radiologic Studies: CT C/A/P on 1/26: 1. No convincing evidence of recurrent or metastatic disease within the chest, abdomen, or pelvis. 2. Stable small bilateral pulmonary nodules dating back to 09/10/2020, continued attention on follow-up imaging is suggested. 3.  Aortic Atherosclerosis (ICD10-I70.0).  Assessment & Plan: Tina Decker is a 51 y.o. 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   Patient continues to do well and is NED on exam.    Recent CT C/A/P without  definitive evidence of metastatic disease.  Continued small pulmonary nodules, unchanged.  Per NCCN surveillance recommendations, we discussed continuing with visits every 3-4 months for 2-3 years. Given high risk histology, I recommend CT surveillance imaging.  We will plan to perform a CT scan in approximately 6 months.  We will schedule this when I see her for her next follow-up visit.   We reviewed signs and symptoms that would be concerning for disease recurrence, and the patient knows to call if she develops any of these before her next scheduled visit.  28 minutes of total time was spent for this  patient encounter, including preparation, face-to-face counseling with the patient and coordination of care, and documentation of the encounter.  Jeral Pinch, MD  Division of Gynecologic Oncology  Department of Obstetrics and Gynecology  [facility]

## 2021-06-22 ENCOUNTER — Other Ambulatory Visit: Payer: Self-pay

## 2021-06-22 ENCOUNTER — Encounter: Payer: Self-pay | Admitting: Gynecologic Oncology

## 2021-06-22 ENCOUNTER — Inpatient Hospital Stay: Payer: 59 | Attending: Gynecologic Oncology | Admitting: Gynecologic Oncology

## 2021-06-22 VITALS — BP 133/82 | HR 84 | Temp 98.8°F | Resp 16 | Ht 66.0 in | Wt 260.0 lb

## 2021-06-22 DIAGNOSIS — Z8542 Personal history of malignant neoplasm of other parts of uterus: Secondary | ICD-10-CM | POA: Diagnosis not present

## 2021-06-22 DIAGNOSIS — Z90710 Acquired absence of both cervix and uterus: Secondary | ICD-10-CM | POA: Insufficient documentation

## 2021-06-22 DIAGNOSIS — C499 Malignant neoplasm of connective and soft tissue, unspecified: Secondary | ICD-10-CM

## 2021-06-22 DIAGNOSIS — R918 Other nonspecific abnormal finding of lung field: Secondary | ICD-10-CM | POA: Diagnosis not present

## 2021-06-22 DIAGNOSIS — Z90722 Acquired absence of ovaries, bilateral: Secondary | ICD-10-CM | POA: Diagnosis not present

## 2021-07-09 ENCOUNTER — Other Ambulatory Visit (HOSPITAL_COMMUNITY): Payer: Self-pay

## 2021-07-09 DIAGNOSIS — R42 Dizziness and giddiness: Secondary | ICD-10-CM | POA: Diagnosis not present

## 2021-07-09 DIAGNOSIS — I10 Essential (primary) hypertension: Secondary | ICD-10-CM | POA: Diagnosis not present

## 2021-07-09 DIAGNOSIS — Z9109 Other allergy status, other than to drugs and biological substances: Secondary | ICD-10-CM | POA: Diagnosis not present

## 2021-07-09 MED ORDER — LOSARTAN POTASSIUM 25 MG PO TABS
25.0000 mg | ORAL_TABLET | Freq: Every day | ORAL | 0 refills | Status: DC
  Filled 2021-07-09: qty 90, 90d supply, fill #0

## 2021-07-09 MED ORDER — HYDROCHLOROTHIAZIDE 12.5 MG PO TABS
12.5000 mg | ORAL_TABLET | Freq: Every morning | ORAL | 0 refills | Status: DC
  Filled 2021-07-09: qty 90, 90d supply, fill #0

## 2021-08-28 DIAGNOSIS — M461 Sacroiliitis, not elsewhere classified: Secondary | ICD-10-CM | POA: Diagnosis not present

## 2021-08-28 DIAGNOSIS — M9905 Segmental and somatic dysfunction of pelvic region: Secondary | ICD-10-CM | POA: Diagnosis not present

## 2021-08-28 DIAGNOSIS — M9903 Segmental and somatic dysfunction of lumbar region: Secondary | ICD-10-CM | POA: Diagnosis not present

## 2021-08-28 DIAGNOSIS — M545 Low back pain, unspecified: Secondary | ICD-10-CM | POA: Diagnosis not present

## 2021-08-28 DIAGNOSIS — M9904 Segmental and somatic dysfunction of sacral region: Secondary | ICD-10-CM | POA: Diagnosis not present

## 2021-08-28 DIAGNOSIS — M542 Cervicalgia: Secondary | ICD-10-CM | POA: Diagnosis not present

## 2021-08-28 DIAGNOSIS — M9902 Segmental and somatic dysfunction of thoracic region: Secondary | ICD-10-CM | POA: Diagnosis not present

## 2021-08-28 DIAGNOSIS — R293 Abnormal posture: Secondary | ICD-10-CM | POA: Diagnosis not present

## 2021-08-28 DIAGNOSIS — M9901 Segmental and somatic dysfunction of cervical region: Secondary | ICD-10-CM | POA: Diagnosis not present

## 2021-09-21 ENCOUNTER — Ambulatory Visit: Payer: 59 | Admitting: Gynecologic Oncology

## 2021-09-22 ENCOUNTER — Encounter: Payer: Self-pay | Admitting: Gynecologic Oncology

## 2021-09-24 ENCOUNTER — Encounter: Payer: Self-pay | Admitting: Gynecologic Oncology

## 2021-09-24 ENCOUNTER — Inpatient Hospital Stay: Payer: 59 | Attending: Gynecologic Oncology | Admitting: Gynecologic Oncology

## 2021-09-24 VITALS — BP 134/71 | HR 87 | Temp 98.4°F | Resp 16 | Ht 66.0 in | Wt 247.0 lb

## 2021-09-24 DIAGNOSIS — Z90710 Acquired absence of both cervix and uterus: Secondary | ICD-10-CM | POA: Insufficient documentation

## 2021-09-24 DIAGNOSIS — Z90722 Acquired absence of ovaries, bilateral: Secondary | ICD-10-CM | POA: Diagnosis not present

## 2021-09-24 DIAGNOSIS — K5909 Other constipation: Secondary | ICD-10-CM

## 2021-09-24 DIAGNOSIS — R918 Other nonspecific abnormal finding of lung field: Secondary | ICD-10-CM | POA: Insufficient documentation

## 2021-09-24 DIAGNOSIS — K5900 Constipation, unspecified: Secondary | ICD-10-CM | POA: Insufficient documentation

## 2021-09-24 DIAGNOSIS — R140 Abdominal distension (gaseous): Secondary | ICD-10-CM | POA: Diagnosis not present

## 2021-09-24 DIAGNOSIS — C549 Malignant neoplasm of corpus uteri, unspecified: Secondary | ICD-10-CM

## 2021-09-24 DIAGNOSIS — Z8542 Personal history of malignant neoplasm of other parts of uterus: Secondary | ICD-10-CM

## 2021-09-24 NOTE — Progress Notes (Signed)
Gynecologic Oncology Return Clinic Visit ? ?09/24/2021 ? ?Reason for Visit: surveillance visit in the setting of LMS ? ?Treatment History: ?Oncology History Overview Note  ?Endometrial ablation in 2017 - no bleeding after until she presented for annual exam in early 2022 and reported episode of heavy vaginal bleeding after COVID booster. ? ?Pap 2022: LSIL, HR HPV+ ?  ?Leiomyosarcoma of body of uterus ([place])  ?07/11/2020 Imaging  ? MRI pelvis: large heterogeneously solid mass measuring 9.7 x 8.1 x 8 cm causing compression to the vagina and cervical effacement. It was described as mostly reflecting the large fibroid. The mass was felt to be emanating from the lower segment ofthe uterus. ? ?  ?08/18/2020 Surgery  ? Ex lap, modified radical total abdominal hysterectomy, BSO, ureterolysis, lysis of adhesions, and cystoscopy. ?Dr. Zella Richer ?[facility] [place] ? ?Findings: Upon examination under anesthesia, the external genitalia was normal. Upon bimanual examination, there was a large smooth mass that was filling the vagina to approximately 4 cm from the introitus. It was a smooth surface, approximately 10 cm in size. The cervix was unable to be palpated secondary to the mass. Upon exploratory laparotomy, the omentum appeared grossly normal. The appendix appeared grossly normal. The bowel evaluated from ileocecal valve to the ligament of Treitz and normal. There were no peritoneal mesenteric implants. On pelvic survey, the uterine fundus was normal in size. However, there was a very large expansile mass that was emanating from the lower uterine segment, extended into bilateral parametria, sidewall free upon dissection appeared consistent with fibroid. Bilateral fallopian tubes and ovaries were grossly normal. Anterior and posterior cul-de-sac were normal, although they were obliterated by the mass. The specimen was removed en bloc. Photographs were taken. Upon cystoscopic evaluation after closure of the cuff, there  was brisk efflux of urine from each ureteral orifice and there was no bladderpathology or injury noted. ? ? ?  ?08/18/2020 Pathology Results  ? Stage IB leiomyosarcoma of the uterus ?Tumor board recommendations: surveillance, CT chest ?  ?08/18/2020 Initial Diagnosis  ? Leiomyosarcoma of body of uterus ([place]) ?  ?09/10/2020 Imaging  ? CT chest: Solitary 3 mm right upper lobe pulmonary nodule. Consider follow-up CT chest in 6 months for reassessment. ?  ? ? ?Interval History: ?Reports overall doing well.  Continues to struggle with intermittent constipation and bloating, which she describes as annoying, symptoms that have been present since her surgery.  She just recently started a probiotic and will be starting some fiber bars.  She denies any vaginal bleeding or discharge.  Denies any urinary symptoms.  Denies any abdominal or pelvic pain. ? ?Past Medical/Surgical History: ?Past Medical History:  ?Diagnosis Date  ? Cataract   ? Leiomyosarcoma ([place])   ? Obesity (BMI 30-39.9)   ? ? ?Past Surgical History:  ?Procedure Laterality Date  ? AXILLARY ABCESS IRRIGATION AND DEBRIDEMENT Left 10/2008  ? COMBINED HYSTEROSCOPY DIAGNOSTIC / D&C    ? ENDOMETRIAL ABLATION W/ NOVASURE    ? RADICAL ABDOMINAL HYSTERECTOMY    ? TONSILLECTOMY    ? ? ?Family History  ?Problem Relation Age of Onset  ? Breast cancer Mother   ? Atrial fibrillation Father   ? Hyperlipidemia Father   ? ? ?Social History  ? ?Socioeconomic History  ? Marital status: Single  ?  Spouse name: Not on file  ? Number of children: Not on file  ? Years of education: Not on file  ? Highest education level: Not on file  ?Occupational History  ? Occupation: OR  Nurse  ?  Employer: [employer]  ?Tobacco Use  ? Smoking status: Never  ? Smokeless tobacco: Never  ?Vaping Use  ? Vaping Use: Never used  ?Substance and Sexual Activity  ? Alcohol use: Yes  ?  Comment: Ocassional  ? Drug use: Never  ? Sexual activity: Not Currently  ?Other Topics Concern  ? Not on file  ?Social History  Narrative  ? Not on file  ? ?Social Determinants of Health  ? ?Financial Resource Strain: Not on file  ?Food Insecurity: Not on file  ?Transportation Needs: Not on file  ?Physical Activity: Not on file  ?Stress: Not on file  ?Social Connections: Not on file  ? ? ?Current Medications: ? ?Current Outpatient Medications:  ?  fluticasone (FLONASE) 50 MCG/ACT nasal spray, Place 2 sprays into both nostrils 2 (two) times daily as needed., Disp: , Rfl:  ?  hydrochlorothiazide (HYDRODIURIL) 12.5 MG tablet, Take 1 tablet (12.5 mg total) by mouth in the morning., Disp: 90 tablet, Rfl: 0 ?  losartan (COZAAR) 25 MG tablet, Take 1 tablet (25 mg total) by mouth daily., Disp: 90 tablet, Rfl: 0 ?  Multiple Vitamin (MULTIVITAMIN) tablet, Take 1 tablet by mouth daily., Disp: , Rfl:  ?  loratadine (CLARITIN) 10 MG tablet, Take 10 mg by mouth as needed for allergies. (Patient not taking: Reported on 06/22/2021), Disp: , Rfl:  ? ?Review of Systems: ?Pertinent positives as per HPI. ?Denies appetite changes, fevers, chills, fatigue, unexplained weight changes. ?Denies hearing loss, neck lumps or masses, mouth sores, ringing in ears or voice changes. ?Denies cough or wheezing.  Denies shortness of breath. ?Denies chest pain or palpitations. Denies leg swelling. ?Denies abdominal pain, blood in stools, diarrhea, nausea, vomiting, or early satiety. ?Denies pain with intercourse, dysuria, frequency, hematuria or incontinence. ?Denies hot flashes, pelvic pain, vaginal bleeding or vaginal discharge.   ?Denies joint pain, back pain or muscle pain/cramps. ?Denies itching, rash, or wounds. ?Denies dizziness, headaches, numbness or seizures. ?Denies swollen lymph nodes or glands, denies easy bruising or bleeding. ?Denies anxiety, depression, confusion, or decreased concentration. ? ?Physical Exam: ?BP 134/71 (BP Location: Right Arm, Patient Position: Sitting)   Pulse 87   Temp 98.4 ?F (36.9 ?C) (Oral)   Resp 16   Ht '5\' 6"'$  (1.676 m)   Wt 247 lb  (112 kg)   SpO2 97%   BMI 39.87 kg/m?  ?General: Alert, oriented, no acute distress. ?HEENT: Normocephalic, atraumatic, sclera anicteric. ?Chest: Clear to auscultation bilaterally.  No wheezes or rhonchi. ?Cardiovascular: Regular rate and rhythm, no murmurs. ?Abdomen: Obese, soft, nontender.  Normoactive bowel sounds.  No masses or hepatosplenomegaly appreciated.  Well-healed incision. ?Extremities: Grossly normal range of motion.  Warm, well perfused.  No edema bilaterally. ?Skin: No rashes or lesions noted. ?Lymphatics: No cervical, supraclavicular, or inguinal adenopathy. ?GU: Normal appearing external genitalia without erythema, excoriation, or lesions.  Speculum exam reveals well rugated vaginal mucosa, no lesions or masses noted.  Bimanual exam reveals cuff intact, no nodularity or masses.  Rectovaginal exam confirms these findings. ? ?Laboratory & Radiologic Studies: ?CT C/A/P: 06/18/21: ?IMPRESSION: ?1. No convincing evidence of recurrent or metastatic disease within ?the chest, abdomen, or pelvis. ?2. Stable small bilateral pulmonary nodules dating back to ?09/10/2020, continued attention on follow-up imaging is suggested. ?3.  Aortic Atherosclerosis (ICD10-I70.0). ? ?Assessment & Plan: ?Tina Decker is a 51 y.o. woman with with early-stage leiomyosarcoma of the uterus treated with definitive surgery in 07/2020 who presents for surveillance visit today. ?  ?Patient  is doing well and is NED on exam.   ? ?She continues to have some symptoms, bloating and constipation, that she has had since surgery.  Denies any significant change to the symptoms.  She is starting some dietary interventions to see if she can improve her constipation.  She also is seeing GI later this month for her routine colon cancer screening.  I have asked her to keep me updated.  If symptoms do not improve or if there is anything seen on her colonoscopy, we will move up her CT scan. ?  ?Her most recent CT C/A/P in January without  definitive evidence of metastatic disease.  Continued small pulmonary nodules, unchanged. ?  ?Per NCCN surveillance recommendations, we discussed continuing with visits every 3-4 months for 2-3 years. Given high risk

## 2021-09-24 NOTE — Patient Instructions (Addendum)
It was good to see you today.  Not see or feel any evidence of cancer recurrence on your exam.   ? ?I will see you for your next follow-up visit in approximately 4 months.  If you develop any new and concerning symptoms before then, please call me.  We will plan to repeat imaging around the time of your next visit. ? ?Keep me posted on your visit with GI and your colonoscopy. ?

## 2021-09-30 ENCOUNTER — Other Ambulatory Visit (HOSPITAL_COMMUNITY): Payer: Self-pay

## 2021-09-30 MED ORDER — LOSARTAN POTASSIUM 25 MG PO TABS
25.0000 mg | ORAL_TABLET | Freq: Every day | ORAL | 1 refills | Status: DC
  Filled 2021-09-30: qty 90, 90d supply, fill #0
  Filled 2021-12-31: qty 90, 90d supply, fill #1

## 2021-09-30 MED ORDER — HYDROCHLOROTHIAZIDE 12.5 MG PO TABS
12.5000 mg | ORAL_TABLET | Freq: Every morning | ORAL | 1 refills | Status: DC
  Filled 2021-09-30: qty 90, 90d supply, fill #0
  Filled 2021-12-31: qty 90, 90d supply, fill #1

## 2021-10-10 DIAGNOSIS — Z1211 Encounter for screening for malignant neoplasm of colon: Secondary | ICD-10-CM | POA: Diagnosis not present

## 2021-10-19 DIAGNOSIS — K635 Polyp of colon: Secondary | ICD-10-CM | POA: Diagnosis not present

## 2021-10-27 ENCOUNTER — Telehealth: Payer: Self-pay | Admitting: *Deleted

## 2021-10-27 NOTE — Telephone Encounter (Signed)
Pt has been rescheduled, per provider, to 12/03/21 @ 1:30pm.

## 2021-10-27 NOTE — Telephone Encounter (Signed)
Attempted to reach the patient to reschedule appt from 7/17 to either 7/13 or 7/18

## 2021-11-16 DIAGNOSIS — K6289 Other specified diseases of anus and rectum: Secondary | ICD-10-CM | POA: Diagnosis not present

## 2021-11-16 DIAGNOSIS — Z6839 Body mass index (BMI) 39.0-39.9, adult: Secondary | ICD-10-CM | POA: Diagnosis not present

## 2021-11-16 DIAGNOSIS — R030 Elevated blood-pressure reading, without diagnosis of hypertension: Secondary | ICD-10-CM | POA: Diagnosis not present

## 2021-11-16 DIAGNOSIS — K648 Other hemorrhoids: Secondary | ICD-10-CM | POA: Diagnosis not present

## 2021-11-30 ENCOUNTER — Ambulatory Visit (HOSPITAL_BASED_OUTPATIENT_CLINIC_OR_DEPARTMENT_OTHER)
Admission: RE | Admit: 2021-11-30 | Discharge: 2021-11-30 | Disposition: A | Discharge status: Home / Self Care | Payer: 59 | Source: Ambulatory Visit | Attending: Gynecologic Oncology | Admitting: Gynecologic Oncology

## 2021-11-30 DIAGNOSIS — R911 Solitary pulmonary nodule: Secondary | ICD-10-CM | POA: Diagnosis not present

## 2021-11-30 DIAGNOSIS — C549 Malignant neoplasm of corpus uteri, unspecified: Secondary | ICD-10-CM | POA: Diagnosis not present

## 2021-11-30 DIAGNOSIS — C539 Malignant neoplasm of cervix uteri, unspecified: Secondary | ICD-10-CM | POA: Diagnosis not present

## 2021-11-30 DIAGNOSIS — N2889 Other specified disorders of kidney and ureter: Secondary | ICD-10-CM | POA: Diagnosis not present

## 2021-11-30 MED ORDER — IOHEXOL 300 MG/ML  SOLN
100.0000 mL | Freq: Once | INTRAMUSCULAR | Status: AC | PRN
  Administered 2021-11-30: 85 mL via INTRAVENOUS

## 2021-12-02 ENCOUNTER — Encounter: Payer: Self-pay | Admitting: Gynecologic Oncology

## 2021-12-03 ENCOUNTER — Inpatient Hospital Stay: Payer: 59 | Attending: Gynecologic Oncology | Admitting: Gynecologic Oncology

## 2021-12-03 ENCOUNTER — Encounter: Payer: Self-pay | Admitting: Gynecologic Oncology

## 2021-12-03 ENCOUNTER — Other Ambulatory Visit: Payer: Self-pay

## 2021-12-03 VITALS — BP 133/83 | HR 83 | Temp 98.3°F | Resp 16 | Ht 66.0 in | Wt 256.0 lb

## 2021-12-03 DIAGNOSIS — Z8542 Personal history of malignant neoplasm of other parts of uterus: Secondary | ICD-10-CM | POA: Insufficient documentation

## 2021-12-03 DIAGNOSIS — R140 Abdominal distension (gaseous): Secondary | ICD-10-CM | POA: Diagnosis not present

## 2021-12-03 DIAGNOSIS — Z90710 Acquired absence of both cervix and uterus: Secondary | ICD-10-CM | POA: Diagnosis not present

## 2021-12-03 DIAGNOSIS — Z90722 Acquired absence of ovaries, bilateral: Secondary | ICD-10-CM | POA: Insufficient documentation

## 2021-12-03 DIAGNOSIS — C549 Malignant neoplasm of corpus uteri, unspecified: Secondary | ICD-10-CM

## 2021-12-03 NOTE — Progress Notes (Signed)
Gynecologic Oncology Return Clinic Visit  12/03/21  Reason for Visit: surveillance visit in the setting of LMS  Treatment History: Oncology History Overview Note  Endometrial ablation in 2017 - no bleeding after until she presented for annual exam in early 2022 and reported episode of heavy vaginal bleeding after COVID booster.  Pap 2022: LSIL, HR HPV+   Leiomyosarcoma of body of uterus ([place])  07/11/2020 Imaging   MRI pelvis: large heterogeneously solid mass measuring 9.7 x 8.1 x 8 cm causing compression to the vagina and cervical effacement. It was described as mostly reflecting the large fibroid. The mass was felt to be emanating from the lower segment ofthe uterus.    08/18/2020 Surgery   Ex lap, modified radical total abdominal hysterectomy, BSO, ureterolysis, lysis of adhesions, and cystoscopy. Dr. Radene Ou, PA  Findings: Upon examination under anesthesia, the external genitalia was normal. Upon bimanual examination, there was a large smooth mass that was filling the vagina to approximately 4 cm from the introitus. It was a smooth surface, approximately 10 cm in size. The cervix was unable to be palpated secondary to the mass. Upon exploratory laparotomy, the omentum appeared grossly normal. The appendix appeared grossly normal. The bowel evaluated from ileocecal valve to the ligament of Treitz and normal. There were no peritoneal mesenteric implants. On pelvic survey, the uterine fundus was normal in size. However, there was a very large expansile mass that was emanating from the lower uterine segment, extended into bilateral parametria, sidewall free upon dissection appeared consistent with fibroid. Bilateral fallopian tubes and ovaries were grossly normal. Anterior and posterior cul-de-sac were normal, although they were obliterated by the mass. The specimen was removed en bloc. Photographs were taken. Upon cystoscopic evaluation after closure of the cuff, there  was brisk efflux of urine from each ureteral orifice and there was no bladderpathology or injury noted.     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 Consider follow-up CT chest in 6 months for reassessment.   11/30/2021 Imaging   CT C/A/P: IMPRESSION: 1. No evidence of locally recurrent or metastatic carcinoma within the chest, abdomen or pelvis. 2. Stable, small and presumed benign pulmonary nodules. 3. Ventral hernias.     Interval History: Doing well.  Continues to have some bloating, similar to at her last visit.  She notes this mostly when she is working multiple 10 to 12-hour days in a row.  She is trying to drink more water but knows that she is probably not drinking enough.  She feels significantly better when she will have multiple days off of work. Had a colonoscopy recently.  Reports that this showed internal hemorrhoids as well as a benign polyp.  She denies any vaginal bleeding or discharge.  Denies any abdominal or pelvic pain.  Reports good appetite.   Past Medical/Surgical History: Past Medical History:  Diagnosis Date   Cataract    Leiomyosarcoma ([place])    Obesity (BMI 30-39.9)     Past Surgical History:  Procedure Laterality Date   AXILLARY ABCESS IRRIGATION AND DEBRIDEMENT Left 10/2008   COLONOSCOPY     COMBINED HYSTEROSCOPY DIAGNOSTIC / D&C     ENDOMETRIAL ABLATION W/ NOVASURE     RADICAL ABDOMINAL HYSTERECTOMY     TONSILLECTOMY  Family History  Problem Relation Age of Onset   Breast cancer Mother    Atrial fibrillation Father    Hyperlipidemia Father    Prostate cancer Father     Social History   Socioeconomic History   Marital status: Single    Spouse name: Not on file   Number of children: Not on file    Years of education: Not on file   Highest education level: Not on file  Occupational History   Occupation: OR Nurse    Employer: [employer]  Tobacco Use   Smoking status: Never   Smokeless tobacco: Never  Vaping Use   Vaping Use: Never used  Substance and Sexual Activity   Alcohol use: Yes    Comment: Ocassional   Drug use: Never   Sexual activity: Yes  Other Topics Concern   Not on file  Social History Narrative   Not on file   Social Determinants of Health   Financial Resource Strain: Not on file  Food Insecurity: Not on file  Transportation Needs: Not on file  Physical Activity: Not on file  Stress: Not on file  Social Connections: Not on file    Current Medications:  Current Outpatient Medications:    fluticasone (FLONASE) 50 MCG/ACT nasal spray, Place 2 sprays into both nostrils 2 (two) times daily as needed., Disp: , Rfl:    hydrochlorothiazide (HYDRODIURIL) 12.5 MG tablet, Take 1 tablet (12.5 mg total) by mouth in the morning., Disp: 90 tablet, Rfl: 1   ibuprofen (IBUPROFEN 100 JUNIOR STRENGTH) 100 MG chewable tablet, , Disp: , Rfl:    loratadine-pseudoephedrine (CLARITIN-D 12 HOUR) 5-120 MG tablet, , Disp: , Rfl:    losartan (COZAAR) 25 MG tablet, Take 1 tablet (25 mg total) by mouth daily., Disp: 90 tablet, Rfl: 1   Multiple Vitamin (MULTIVITAMIN) tablet, Take 1 tablet by mouth daily., Disp: , Rfl:   Review of Systems: + Bloating Denies appetite changes, fevers, chills, fatigue, unexplained weight changes. Denies hearing loss, neck lumps or masses, mouth sores, ringing in ears or voice changes. Denies cough or wheezing.  Denies shortness of breath. Denies chest pain or palpitations. Denies leg swelling. Denies abdominal pain, blood in stools, constipation, diarrhea, nausea, vomiting, or early satiety. Denies pain with intercourse, dysuria, frequency, hematuria or incontinence. Denies hot flashes, pelvic pain, vaginal bleeding or vaginal discharge.   Denies  joint pain, back pain or muscle pain/cramps. Denies itching, rash, or wounds. Denies dizziness, headaches, numbness or seizures. Denies swollen lymph nodes or glands, denies easy bruising or bleeding. Denies anxiety, depression, confusion, or decreased concentration.  Physical Exam: BP 133/83 (BP Location: Left Arm, Patient Position: Sitting)   Pulse 83   Temp 98.3 F (36.8 C) (Tympanic)   Resp 16   Ht '5\' 6"'$  (1.676 m)   Wt 256 lb (116.1 kg)   SpO2 97%   BMI 41.32 kg/m  General: Alert, oriented, no acute distress. HEENT: Normocephalic, atraumatic, sclera anicteric. Chest: Clear to auscultation bilaterally.  No wheezes or rhonchi. Cardiovascular: Regular rate and rhythm, no murmurs. Abdomen: Obese, soft, nontender.  Normoactive bowel sounds.  No masses or hepatosplenomegaly appreciated.  Well-healed incision.  4 cm hernia evident to the left of the incision above the umbilicus. Extremities: Grossly normal range of motion.  Warm, well perfused.  No edema bilaterally. Skin: No rashes or lesions noted. Lymphatics: No cervical, supraclavicular, or inguinal adenopathy. GU: Normal appearing external genitalia without erythema, excoriation, or lesions.  Speculum exam reveals well rugated vaginal mucosa, no  lesions or masses noted.  Bimanual exam reveals cuff intact, no nodularity or masses.  Rectovaginal exam confirms these findings.  Laboratory & Radiologic Studies: CT C/A/P: IMPRESSION: 1. No evidence of locally recurrent or metastatic carcinoma within the chest, abdomen or pelvis. 2. Stable, small and presumed benign pulmonary nodules. 3. Ventral hernias.  Assessment & Plan: Tina Decker is a 51 y.o. 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   Patient is doing well and is NED on exam.    She continues to have some bloating, unchanged.  This improves when she has multiple days off of work  and I suspect may be related to changes in p.o. intake on days that she is working.  Has now had a colonoscopy which she reports is normal.  Recent CT scan showed no evidence of metastatic disease.   She is aware of her hernia, not bothered by it.  Discussed reasons that she should call or present to the emergency department.   Per NCCN surveillance recommendations, we discussed continuing with visits every 3-4 months for 2-3 years. Given high risk histology, I recommend CT surveillance imaging.  We will plan to perform a CT scan in approximately 6 months (Dec or Jan).  We will schedule this when I see her for her next follow-up visit in October.   We reviewed signs and symptoms that would be concerning for disease recurrence, and the patient knows to call if she develops any of these before her next scheduled visit.  22 minutes of total time was spent for this patient encounter, including preparation, face-to-face counseling with the patient and coordination of care, and documentation of the encounter.  Jeral Pinch, MD  Division of Gynecologic Oncology  Department of Obstetrics and Gynecology  [facility]

## 2021-12-03 NOTE — Patient Instructions (Signed)
It was good to see you today. I don't see or feel any evidence of cancer on your exam.  I will see you in 3 months for follow-up. At that visit, we will get your next CT scan scheduled for December or January.   As always, if you develop any new and concerning symptoms before your next visit, please call to see me sooner.

## 2021-12-04 ENCOUNTER — Other Ambulatory Visit (HOSPITAL_BASED_OUTPATIENT_CLINIC_OR_DEPARTMENT_OTHER): Payer: Self-pay

## 2021-12-04 DIAGNOSIS — Z0289 Encounter for other administrative examinations: Secondary | ICD-10-CM

## 2021-12-07 ENCOUNTER — Ambulatory Visit: Payer: 59 | Admitting: Gynecologic Oncology

## 2021-12-12 ENCOUNTER — Ambulatory Visit (HOSPITAL_BASED_OUTPATIENT_CLINIC_OR_DEPARTMENT_OTHER)
Admission: RE | Admit: 2021-12-12 | Discharge: 2021-12-12 | Disposition: A | Discharge status: Home / Self Care | Payer: 59 | Source: Ambulatory Visit | Attending: Gynecologic Oncology | Admitting: Gynecologic Oncology

## 2021-12-12 DIAGNOSIS — Z1231 Encounter for screening mammogram for malignant neoplasm of breast: Secondary | ICD-10-CM | POA: Insufficient documentation

## 2021-12-12 DIAGNOSIS — Z0289 Encounter for other administrative examinations: Secondary | ICD-10-CM | POA: Diagnosis not present

## 2021-12-22 ENCOUNTER — Other Ambulatory Visit: Payer: Self-pay

## 2021-12-22 ENCOUNTER — Other Ambulatory Visit: Payer: Self-pay | Admitting: *Deleted

## 2021-12-22 DIAGNOSIS — R928 Other abnormal and inconclusive findings on diagnostic imaging of breast: Secondary | ICD-10-CM

## 2021-12-28 ENCOUNTER — Telehealth: Payer: 59 | Admitting: Physician Assistant

## 2021-12-28 DIAGNOSIS — L237 Allergic contact dermatitis due to plants, except food: Secondary | ICD-10-CM | POA: Diagnosis not present

## 2021-12-28 MED ORDER — PREDNISONE 10 MG PO TABS
ORAL_TABLET | ORAL | 0 refills | Status: AC
  Filled 2021-12-28: qty 37, 14d supply, fill #0

## 2021-12-28 NOTE — Progress Notes (Signed)
E-Visit for Poison Ivy  We are sorry that you are not feeing well.  Here is how we plan to help!  Based on what you have shared with me it looks like you have had an allergic reaction to the oily resin from a group of plants.  This resin is very sticky, so it easily attaches to your skin, clothing, tools equipment, and pet's fur.    This blistering rash is often called poison ivy rash although it can come from contact with the leaves, stems and roots of poison ivy, poison oak and poison sumac.  The oily resin contains urushiol (u-ROO-she-ol) that produces a skin rash on exposed skin.  The severity of the rash depends on the amount of urushiol that gets on your skin.  A section of skin with more urushiol on it may develop a rash sooner.  The rash usually develops 12-48 hours after exposure and can last two to three weeks.  Your skin must come in direct contact with the plant's oil to be affected.  Blister fluid doesn't spread the rash.  However, if you come into contact with a piece of clothing or pet fur that has urushiol on it, the rash may spread out.  You can also transfer the oil to other parts of your body with your fingers.  Often the rash looks like a straight line because of the way the plant brushes against your skin.  Since your rash is widespread or has resulted in a large number of blisters, I have prescribed an oral corticosteroid.  Please follow these recommendations:  I have sent a prednisone dose pack to your chosen pharmacy. Be sure to follow the instructions carefully and complete the entire prescription. You may use Benadryl or Caladryl topical lotions to sooth the itch and remember cool, not hot, showers and baths can help relieve the itching!  Place cool, wet compresses on the affected area for 15-30 minutes several times a day.  You may also take oral antihistamines, such as diphenhydramine (Benadryl, others), which may also help you sleep better.  Watch your skin for any purulent  (pus) drainage or red streaking from the site.  If this occurs, contact your provider.  You may require an antibiotic for a skin infection.  Make sure that the clothes you were wearing as well as any towels or sheets that may have come in contact with the oil (urushiol) are washed in detergent and hot water.       I have developed the following plan to treat your condition I am prescribing a two week course of steroids (37 tablets of 10 mg prednisone).  Days 1-4 take 4 tablets (40 mg) daily  Days 5-8 take 3 tablets (30 mg) daily, Days 9-11 take 2 tablets (20 mg) daily, Days 12-14 take 1 tablet (10 mg) daily.    What can you do to prevent this rash?  Avoid the plants.  Learn how to identify poison ivy, poison oak and poison sumac in all seasons.  When hiking or engaging in other activities that might expose you to these plants, try to stay on cleared pathways.  If camping, make sure you pitch your tent in an area free of these plants.  Keep pets from running through wooded areas so that urushiol doesn't accidentally stick to their fur, which you may touch.  Remove or kill the plants.  In your yard, you can get rid of poison ivy by applying an herbicide or pulling it out of   the ground, including the roots, while wearing heavy gloves.  Afterward remove the gloves and thoroughly wash them and your hands.  Don't burn poison ivy or related plants because the urushiol can be carried by smoke.  Wear protective clothing.  If needed, protect your skin by wearing socks, boots, pants, long sleeves and vinyl gloves.  Wash your skin right away.  Washing off the oil with soap and water within 30 minutes of exposure may reduce your chances of getting a poison ivy rash.  Even washing after an hour or so can help reduce the severity of the rash.  If you walk through some poison ivy and then later touch your shoes, you may get some urushiol on your hands, which may then transfer to your face or body by touching or  rubbing.  If the contaminated object isn't cleaned, the urushiol on it can still cause a skin reaction years later.    Be careful not to reuse towels after you have washed your skin.  Also carefully wash clothing in detergent and hot water to remove all traces of the oil.  Handle contaminated clothing carefully so you don't transfer the urushiol to yourself, furniture, rugs or appliances.  Remember that pets can carry the oil on their fur and paws.  If you think your pet may be contaminated with urushiol, put on some long rubber gloves and give your pet a bath.  Finally, be careful not to burn these plants as the smoke can contain traces of the oil.  Inhaling the smoke may result in difficulty breathing. If that occurred you should see a physician as soon as possible.  See your doctor right away if:  The reaction is severe or widespread You inhaled the smoke from burning poison ivy and are having difficulty breathing Your skin continues to swell The rash affects your eyes, mouth or genitals Blisters are oozing pus You develop a fever greater than 100 F (37.8 C) The rash doesn't get better within a few weeks.  If you scratch the poison ivy rash, bacteria under your fingernails may cause the skin to become infected.  See your doctor if pus starts oozing from the blisters.  Treatment generally includes antibiotics.  Poison ivy treatments are usually limited to self-care methods.  And the rash typically goes away on its own in two to three weeks.     If the rash is widespread or results in a large number of blisters, your doctor may prescribe an oral corticosteroid, such as prednisone.  If a bacterial infection has developed at the rash site, your doctor may give you a prescription for an oral antibiotic.  MAKE SURE YOU  Understand these instructions. Will watch your condition. Will get help right away if you are not doing well or get worse.   Thank you for choosing an e-visit.  Your  e-visit answers were reviewed by a board certified advanced clinical practitioner to complete your personal care plan. Depending upon the condition, your plan could have included both over the counter or prescription medications.  Please review your pharmacy choice. Make sure the pharmacy is open so you can pick up prescription now. If there is a problem, you may contact your provider through MyChart messaging and have the prescription routed to another pharmacy.  Your safety is important to us. If you have drug allergies check your prescription carefully.   For the next 24 hours you can use MyChart to ask questions about today's visit, request a non-urgent   call back, or ask for a work or school excuse. You will get an email in the next two days asking about your experience. I hope that your e-visit has been valuable and will speed your recovery.   I provided 5 minutes of non face-to-face time during this encounter for chart review and documentation.   

## 2021-12-29 ENCOUNTER — Ambulatory Visit
Admission: RE | Admit: 2021-12-29 | Discharge: 2021-12-29 | Disposition: A | Discharge status: Home / Self Care | Payer: 59 | Source: Ambulatory Visit

## 2021-12-29 ENCOUNTER — Other Ambulatory Visit (HOSPITAL_COMMUNITY): Payer: Self-pay

## 2021-12-29 DIAGNOSIS — R928 Other abnormal and inconclusive findings on diagnostic imaging of breast: Secondary | ICD-10-CM | POA: Diagnosis not present

## 2021-12-29 DIAGNOSIS — N6489 Other specified disorders of breast: Secondary | ICD-10-CM | POA: Diagnosis not present

## 2021-12-31 ENCOUNTER — Other Ambulatory Visit (HOSPITAL_COMMUNITY): Payer: Self-pay

## 2022-01-15 ENCOUNTER — Telehealth: Payer: 59 | Admitting: Physician Assistant

## 2022-01-15 DIAGNOSIS — R21 Rash and other nonspecific skin eruption: Secondary | ICD-10-CM

## 2022-01-15 NOTE — Progress Notes (Signed)
Because you have failed the 1st line treatment, I feel your condition warrants further evaluation and I recommend that you be seen for a face to face visit.  Please contact your primary care physician practice to be seen. Many offices offer virtual options to be seen via video if you are not comfortable going in person to a medical facility at this time.   NOTE: You will NOT be charged for this eVisit.  If you do not have a PCP, [facility] offers a free physician referral service available at [phone]. Our trained staff has the experience, knowledge and resources to put you in touch with a physician who is right for you.   If you are having a true medical emergency please call 911.      For an urgent face to face visit, [facility] has seven urgent care centers for your convenience:     [facility] [place] [facility] [place] Get Driving Directions [phone] [address] [place], [place] [address]    [facility]) Get Driving Directions [phone] [place], [place] [address]  [facility] [place] [facility]) Get Driving Directions [phone] [address] [place],  [place]  [address]  [facility] - at [address] Get Driving Directions  [phone] [address],  [place] [address]   [facility] Get Driving Directions [phone] [place], [place], [place] [address]   [facility] Get Driving Directions  [phone] [address], [place] [address]   [facility] [place] Get Driving Directions [phone] [address], [place], [place] [address]  Your MyChart E-visit questionnaire answers were reviewed by a board certified advanced clinical practitioner to complete your  personal care plan based on your specific symptoms.  Thank you for using e-Visits.   I provided 5 minutes of non face-to-face time during this encounter for chart review and documentation.

## 2022-01-18 ENCOUNTER — Other Ambulatory Visit (HOSPITAL_COMMUNITY): Payer: Self-pay

## 2022-01-18 DIAGNOSIS — L237 Allergic contact dermatitis due to plants, except food: Secondary | ICD-10-CM | POA: Diagnosis not present

## 2022-01-18 DIAGNOSIS — R0981 Nasal congestion: Secondary | ICD-10-CM | POA: Diagnosis not present

## 2022-01-18 MED ORDER — PREDNISONE 10 MG PO TABS
ORAL_TABLET | ORAL | 0 refills | Status: AC
  Filled 2022-01-18: qty 21, 10d supply, fill #0

## 2022-01-18 MED ORDER — TRIAMCINOLONE ACETONIDE 0.1 % EX CREA
1.0000 | TOPICAL_CREAM | Freq: Two times a day (BID) | CUTANEOUS | 0 refills | Status: DC
  Filled 2022-01-18: qty 30, 7d supply, fill #0

## 2022-02-22 DIAGNOSIS — Z0000 Encounter for general adult medical examination without abnormal findings: Secondary | ICD-10-CM | POA: Diagnosis not present

## 2022-02-22 DIAGNOSIS — E782 Mixed hyperlipidemia: Secondary | ICD-10-CM | POA: Diagnosis not present

## 2022-02-22 DIAGNOSIS — I10 Essential (primary) hypertension: Secondary | ICD-10-CM | POA: Diagnosis not present

## 2022-03-01 ENCOUNTER — Other Ambulatory Visit (HOSPITAL_COMMUNITY): Payer: Self-pay

## 2022-03-01 DIAGNOSIS — C55 Malignant neoplasm of uterus, part unspecified: Secondary | ICD-10-CM | POA: Diagnosis not present

## 2022-03-01 DIAGNOSIS — Z0000 Encounter for general adult medical examination without abnormal findings: Secondary | ICD-10-CM | POA: Diagnosis not present

## 2022-03-01 DIAGNOSIS — I700 Atherosclerosis of aorta: Secondary | ICD-10-CM | POA: Diagnosis not present

## 2022-03-01 DIAGNOSIS — R911 Solitary pulmonary nodule: Secondary | ICD-10-CM | POA: Diagnosis not present

## 2022-03-01 DIAGNOSIS — E782 Mixed hyperlipidemia: Secondary | ICD-10-CM | POA: Diagnosis not present

## 2022-03-01 DIAGNOSIS — I10 Essential (primary) hypertension: Secondary | ICD-10-CM | POA: Diagnosis not present

## 2022-03-01 MED ORDER — HYDROCHLOROTHIAZIDE 12.5 MG PO TABS
12.5000 mg | ORAL_TABLET | Freq: Every morning | ORAL | 1 refills | Status: AC
  Filled 2022-03-01 – 2022-04-06 (×2): qty 90, 90d supply, fill #0
  Filled 2022-05-19: qty 90, 90d supply, fill #1

## 2022-03-01 MED ORDER — LOSARTAN POTASSIUM 25 MG PO TABS
25.0000 mg | ORAL_TABLET | Freq: Every day | ORAL | 1 refills | Status: AC
  Filled 2022-03-01 – 2022-04-06 (×2): qty 90, 90d supply, fill #0
  Filled 2022-05-19: qty 90, 90d supply, fill #1

## 2022-03-12 ENCOUNTER — Telehealth: Payer: Self-pay

## 2022-03-12 ENCOUNTER — Inpatient Hospital Stay: Payer: 59 | Admitting: Gynecologic Oncology

## 2022-03-12 DIAGNOSIS — C549 Malignant neoplasm of corpus uteri, unspecified: Secondary | ICD-10-CM

## 2022-03-12 NOTE — Progress Notes (Unsigned)
Gynecologic Oncology Return Clinic Visit  03/12/22  Reason for Visit: surveillance visit in the setting of LMS  Treatment History: Oncology History Overview Note  Endometrial ablation in 2017 - no bleeding after until she presented for annual exam in early 2022 and reported episode of heavy vaginal bleeding after COVID booster.  Pap 2022: LSIL, HR HPV+   Leiomyosarcoma of body of uterus ([place])  07/11/2020 Imaging   MRI pelvis: large heterogeneously solid mass measuring 9.7 x 8.1 x 8 cm causing compression to the vagina and cervical effacement. It was described as mostly reflecting the large fibroid. The mass was felt to be emanating from the lower segment ofthe uterus.    08/18/2020 Surgery   Ex lap, modified radical total abdominal hysterectomy, BSO, ureterolysis, lysis of adhesions, and cystoscopy. Dr. Radene Ou, PA  Findings: Upon examination under anesthesia, the external genitalia was normal. Upon bimanual examination, there was a large smooth mass that was filling the vagina to approximately 4 cm from the introitus. It was a smooth surface, approximately 10 cm in size. The cervix was unable to be palpated secondary to the mass. Upon exploratory laparotomy, the omentum appeared grossly normal. The appendix appeared grossly normal. The bowel evaluated from ileocecal valve to the ligament of Treitz and normal. There were no peritoneal mesenteric implants. On pelvic survey, the uterine fundus was normal in size. However, there was a very large expansile mass that was emanating from the lower uterine segment, extended into bilateral parametria, sidewall free upon dissection appeared consistent with fibroid. Bilateral fallopian tubes and ovaries were grossly normal. Anterior and posterior cul-de-sac were normal, although they were obliterated by the mass. The specimen was removed en bloc. Photographs were taken. Upon cystoscopic evaluation after closure of the cuff, there  was brisk efflux of urine from each ureteral orifice and there was no bladderpathology or injury noted.     08/18/2020 Pathology Results   Stage IB leiomyosarcoma of the uterus Tumor board recommendations: surveillance, CT chest   08/18/2020 Initial Diagnosis   Leiomyosarcoma of body of uterus ([place])   09/10/2020 Imaging   CT chest: Solitary 3 mm right upper lobe pulmonary nodule. Consider follow-up CT chest in 6 months for reassessment.   11/30/2021 Imaging   CT C/A/P: IMPRESSION: 1. No evidence of locally recurrent or metastatic carcinoma within the chest, abdomen or pelvis. 2. Stable, small and presumed benign pulmonary nodules. 3. Ventral hernias.    Mammogram last: 12/2021  Interval History: ***  Past Medical/Surgical History: Past Medical History:  Diagnosis Date   Cataract    Leiomyosarcoma (HCC)    Obesity (BMI 30-39.9)     Past Surgical History:  Procedure Laterality Date   AXILLARY ABCESS IRRIGATION AND DEBRIDEMENT Left 10/2008   COLONOSCOPY     COMBINED HYSTEROSCOPY DIAGNOSTIC / D&C     ENDOMETRIAL ABLATION W/ NOVASURE     RADICAL ABDOMINAL HYSTERECTOMY     TONSILLECTOMY      Family History  Problem Relation Age of Onset   Breast cancer Mother 85       HER2+   Atrial fibrillation Father    Hyperlipidemia Father    Prostate cancer Father     Social History   Socioeconomic History   Marital status: Single    Spouse name: Not on file   Number of children: Not on file   Years of education: Not on file   Highest education level: Not on file  Occupational History   Occupation: OR Nurse  Employer: [employer]  Tobacco Use   Smoking status: Never   Smokeless tobacco: Never  Vaping Use   Vaping Use: Never used  Substance and Sexual Activity   Alcohol use: Yes    Comment: Ocassional   Drug use: Never   Sexual activity: Yes  Other Topics Concern   Not on file  Social History Narrative   Not on file   Social Determinants of Health    Financial Resource Strain: Not on file  Food Insecurity: Not on file  Transportation Needs: Not on file  Physical Activity: Not on file  Stress: Not on file  Social Connections: Not on file    Current Medications:  Current Outpatient Medications:    fluticasone (FLONASE) 50 MCG/ACT nasal spray, Place 2 sprays into both nostrils 2 (two) times daily as needed., Disp: , Rfl:    hydrochlorothiazide (HYDRODIURIL) 12.5 MG tablet, Take 1 tablet (12.5 mg total) by mouth every morning., Disp: 90 tablet, Rfl: 1   ibuprofen (IBUPROFEN 100 JUNIOR STRENGTH) 100 MG chewable tablet, , Disp: , Rfl:    loratadine-pseudoephedrine (CLARITIN-D 12 HOUR) 5-120 MG tablet, , Disp: , Rfl:    losartan (COZAAR) 25 MG tablet, Take 1 tablet (25 mg total) by mouth daily., Disp: 90 tablet, Rfl: 1   Multiple Vitamin (MULTIVITAMIN) tablet, Take 1 tablet by mouth daily., Disp: , Rfl:    triamcinolone cream (KENALOG) 0.1 %, Apply 1 application topically 2 (two) times daily., Disp: 30 g, Rfl: 0  Review of Systems: Denies appetite changes, fevers, chills, fatigue, unexplained weight changes. Denies hearing loss, neck lumps or masses, mouth sores, ringing in ears or voice changes. Denies cough or wheezing.  Denies shortness of breath. Denies chest pain or palpitations. Denies leg swelling. Denies abdominal distention, pain, blood in stools, constipation, diarrhea, nausea, vomiting, or early satiety. Denies pain with intercourse, dysuria, frequency, hematuria or incontinence. Denies hot flashes, pelvic pain, vaginal bleeding or vaginal discharge.   Denies joint pain, back pain or muscle pain/cramps. Denies itching, rash, or wounds. Denies dizziness, headaches, numbness or seizures. Denies swollen lymph nodes or glands, denies easy bruising or bleeding. Denies anxiety, depression, confusion, or decreased concentration.  Physical Exam: There were no vitals taken for this visit. General: ***Alert, oriented, no acute  distress. HEENT: ***Posterior oropharynx clear, sclera anicteric. Chest: ***Clear to auscultation bilaterally.  ***Port site clean. Cardiovascular: ***Regular rate and rhythm, no murmurs. Abdomen: ***Obese, soft, nontender.  Normoactive bowel sounds.  No masses or hepatosplenomegaly appreciated.  ***Well-healed scar. Extremities: ***Grossly normal range of motion.  Warm, well perfused.  No edema bilaterally. Skin: ***No rashes or lesions noted. Lymphatics: ***No cervical, supraclavicular, or inguinal adenopathy. GU: Normal appearing external genitalia without erythema, excoriation, or lesions.  Speculum exam reveals ***.  Bimanual exam reveals ***.  ***Rectovaginal exam  confirms ___.  Laboratory & Radiologic Studies: None new  Assessment & Plan: Raeden Belzer is a 51 y.o. 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   Patient is doing well and is NED on exam.     ***She continues to have some bloating, unchanged.  This improves when she has multiple days off of work and I suspect may be related to changes in p.o. intake on days that she is working.  Has now had a colonoscopy which she reports is normal.  Recent CT scan showed no evidence of metastatic disease.   She is aware of her hernia, not bothered by  it.  Discussed reasons that she should call or present to the emergency department.   ***Per NCCN surveillance recommendations, we discussed continuing with visits every 3-4 months for 2-3 years. Given high risk histology, I recommend CT surveillance imaging.  We will plan to perform a CT scan in approximately 6 months (Dec or Jan).  We will schedule this when I see her for her next follow-up visit in October.   We reviewed signs and symptoms that would be concerning for disease recurrence, and the patient knows to call if she develops any of these before her next scheduled visit.  *** minutes of  total time was spent for this patient encounter, including preparation, face-to-face counseling with the patient and coordination of care, and documentation of the encounter.  Jeral Pinch, MD  Division of Gynecologic Oncology  Department of Obstetrics and Gynecology  [facility]

## 2022-03-12 NOTE — Patient Instructions (Signed)
It was good to see you today.  I do not see or feel any evidence of cancer recurrence.  We will let you know once I get your CT results back in January.  Please call sometime in December to schedule your next visit with me for early February.  As always, if you develop any new and concerning symptoms before then, please call to see me sooner.

## 2022-03-12 NOTE — Telephone Encounter (Signed)
Per Dr. Berline Lopes pt needs a CT scan scheduled fro [facility] January.   Appointment scheduled for 06/07/22.  Pt is aware but states she is in the process of selling her home and moving out of state. She needs it in December.  Phone number for Central Scheduling given to pt for her to schedule CT at her convenience.  Dr. Berline Lopes notified

## 2022-03-15 NOTE — Telephone Encounter (Signed)
Pt is scheduled on 05/04/22 for her CT scan.  Dr. Berline Lopes notified

## 2022-04-01 ENCOUNTER — Telehealth: Payer: Self-pay

## 2022-04-01 NOTE — Telephone Encounter (Signed)
Pt called stating she needs to move her appointment with Dr. Berline Lopes due to she is moving to [facility].   In looking at the schedule there was no available openings when compared to her schedule. She states she is scheduled on 12/12 for her CT scan and plans to get it done. She states Dr. Berline Lopes usually calls her with the results.   She wanted Dr. Berline Lopes to know she is sorry she will not be able to see her anymore, but be reassured she will find care at her new location.

## 2022-04-02 NOTE — Telephone Encounter (Signed)
Per Joylene John NP, Pt is scheduled for a phone visit on Friday 12/15 to discuss CT results.

## 2022-04-06 ENCOUNTER — Other Ambulatory Visit (HOSPITAL_COMMUNITY): Payer: Self-pay

## 2022-04-09 ENCOUNTER — Ambulatory Visit: Payer: 59 | Admitting: Gynecologic Oncology

## 2022-05-04 ENCOUNTER — Ambulatory Visit (HOSPITAL_COMMUNITY)
Admission: RE | Admit: 2022-05-04 | Discharge: 2022-05-04 | Disposition: A | Discharge status: Home / Self Care | Payer: 59 | Source: Ambulatory Visit | Attending: Gynecologic Oncology | Admitting: Gynecologic Oncology

## 2022-05-04 DIAGNOSIS — Z90710 Acquired absence of both cervix and uterus: Secondary | ICD-10-CM | POA: Diagnosis not present

## 2022-05-04 DIAGNOSIS — K439 Ventral hernia without obstruction or gangrene: Secondary | ICD-10-CM | POA: Diagnosis not present

## 2022-05-04 DIAGNOSIS — K429 Umbilical hernia without obstruction or gangrene: Secondary | ICD-10-CM | POA: Diagnosis not present

## 2022-05-04 DIAGNOSIS — C539 Malignant neoplasm of cervix uteri, unspecified: Secondary | ICD-10-CM | POA: Diagnosis not present

## 2022-05-04 DIAGNOSIS — C549 Malignant neoplasm of corpus uteri, unspecified: Secondary | ICD-10-CM | POA: Diagnosis not present

## 2022-05-04 DIAGNOSIS — R911 Solitary pulmonary nodule: Secondary | ICD-10-CM | POA: Diagnosis not present

## 2022-05-04 MED ORDER — IOHEXOL 300 MG/ML  SOLN
100.0000 mL | Freq: Once | INTRAMUSCULAR | Status: AC | PRN
  Administered 2022-05-04: 100 mL via INTRAVENOUS

## 2022-05-04 MED ORDER — IOHEXOL 9 MG/ML PO SOLN
ORAL | Status: AC
  Filled 2022-05-04: qty 1000

## 2022-05-07 ENCOUNTER — Inpatient Hospital Stay: Payer: Commercial Managed Care - PPO | Attending: Gynecologic Oncology | Admitting: Gynecologic Oncology

## 2022-05-07 ENCOUNTER — Encounter: Payer: Self-pay | Admitting: Gynecologic Oncology

## 2022-05-07 ENCOUNTER — Telehealth: Payer: 59 | Admitting: Gynecologic Oncology

## 2022-05-07 DIAGNOSIS — C549 Malignant neoplasm of corpus uteri, unspecified: Secondary | ICD-10-CM | POA: Diagnosis not present

## 2022-05-07 NOTE — Progress Notes (Signed)
Gynecologic Oncology Telehealth Note: Gyn-Onc  I connected with Tina Decker on 05/07/22 at  4:30 PM EST by telephone and verified that I am speaking with the correct person using two identifiers.  I discussed the limitations, risks, security and privacy concerns of performing an evaluation and management service by telemedicine and the availability of in-person appointments. I also discussed with the patient that there may be a patient responsible charge related to this service. The patient expressed understanding and agreed to proceed.  Other persons participating in the visit and their role in the encounter: none.  Patient's location: [place] Provider's location: [place]  Reason for Visit: follow-up in the setting of LMS  Treatment History: Oncology History Overview Note  Endometrial ablation in 2017 - no bleeding after until she presented for annual exam in early 2022 and reported episode of heavy vaginal bleeding after COVID booster.  Pap 2022: LSIL, HR HPV+   Leiomyosarcoma of body of uterus ([place])  07/11/2020 Imaging   MRI pelvis: large heterogeneously solid mass measuring 9.7 x 8.1 x 8 cm causing compression to the vagina and cervical effacement. It was described as mostly reflecting the large fibroid. The mass was felt to be emanating from the lower segment ofthe uterus.    08/18/2020 Surgery   Ex lap, modified radical total abdominal hysterectomy, BSO, ureterolysis, lysis of adhesions, and cystoscopy. Dr. Radene Ou, PA  Findings: Upon examination under anesthesia, the external genitalia was normal. Upon bimanual examination, there was a large smooth mass that was filling the vagina to approximately 4 cm from the introitus. It was a smooth surface, approximately 10 cm in size. The cervix was unable to be palpated secondary to the mass. Upon exploratory laparotomy, the omentum appeared grossly normal. The appendix appeared grossly normal. The bowel evaluated from  ileocecal valve to the ligament of Treitz and normal. There were no peritoneal mesenteric implants. On pelvic survey, the uterine fundus was normal in size. However, there was a very large expansile mass that was emanating from the lower uterine segment, extended into bilateral parametria, sidewall free upon dissection appeared consistent with fibroid. Bilateral fallopian tubes and ovaries were grossly normal. Anterior and posterior cul-de-sac were normal, although they were obliterated by the mass. The specimen was removed en bloc. Photographs were taken. Upon cystoscopic evaluation after closure of the cuff, there was brisk efflux of urine from each ureteral orifice and there was no bladderpathology or injury noted.     08/18/2020 Pathology Results   Stage IB leiomyosarcoma of the uterus Tumor board recommendations: surveillance, CT chest   08/18/2020 Initial Diagnosis   Leiomyosarcoma of body of uterus ([place])   09/10/2020 Imaging   CT chest: Solitary 3 mm right upper lobe pulmonary nodule. Consider follow-up CT chest in 6 months for reassessment.   11/30/2021 Imaging   CT C/A/P: IMPRESSION: 1. No evidence of locally recurrent or metastatic carcinoma within the chest, abdomen or pelvis. 2. Stable, small and presumed benign pulmonary nodules. 3. Ventral hernias.     Interval History: Patient is doing well.  Has already moved down to [place] where she is starting a job in early January.  Has a couple more days here at [facility] over Christmas.  Reports feeling well.  Is more aware of her hernia recently, denies significant pain related to the hernia or inability to reduce the hernia.  Denies any vaginal bleeding or discharge.  Reports baseline bowel bladder function.  Past Medical/Surgical History: Past Medical History:  Diagnosis Date  Cataract    Leiomyosarcoma (HCC)    Obesity (BMI 30-39.9)     Past Surgical History:  Procedure Laterality Date   AXILLARY ABCESS IRRIGATION AND  DEBRIDEMENT Left 10/2008   COLONOSCOPY     COMBINED HYSTEROSCOPY DIAGNOSTIC / D&C     ENDOMETRIAL ABLATION W/ NOVASURE     RADICAL ABDOMINAL HYSTERECTOMY     TONSILLECTOMY      Family History  Problem Relation Age of Onset   Breast cancer Mother 45       HER2+   Atrial fibrillation Father    Hyperlipidemia Father    Prostate cancer Father     Social History   Socioeconomic History   Marital status: Single    Spouse name: Not on file   Number of children: Not on file   Years of education: Not on file   Highest education level: Not on file  Occupational History   Occupation: OR Nurse    Employer: [employer]  Tobacco Use   Smoking status: Never   Smokeless tobacco: Never  Vaping Use   Vaping Use: Never used  Substance and Sexual Activity   Alcohol use: Yes    Comment: Ocassional   Drug use: Never   Sexual activity: Yes  Other Topics Concern   Not on file  Social History Narrative   Not on file   Social Determinants of Health   Financial Resource Strain: Not on file  Food Insecurity: Not on file  Transportation Needs: Not on file  Physical Activity: Not on file  Stress: Not on file  Social Connections: Not on file    Current Medications:  Current Outpatient Medications:    fluticasone (FLONASE) 50 MCG/ACT nasal spray, Place 2 sprays into both nostrils 2 (two) times daily as needed., Disp: , Rfl:    hydrochlorothiazide (HYDRODIURIL) 12.5 MG tablet, Take 1 tablet (12.5 mg total) by mouth every morning., Disp: 90 tablet, Rfl: 1   ibuprofen (IBUPROFEN 100 JUNIOR STRENGTH) 100 MG chewable tablet, , Disp: , Rfl:    loratadine-pseudoephedrine (CLARITIN-D 12 HOUR) 5-120 MG tablet, , Disp: , Rfl:    losartan (COZAAR) 25 MG tablet, Take 1 tablet (25 mg total) by mouth daily., Disp: 90 tablet, Rfl: 1   Multiple Vitamin (MULTIVITAMIN) tablet, Take 1 tablet by mouth daily., Disp: , Rfl:    triamcinolone cream (KENALOG) 0.1 %, Apply 1 application topically 2 (two) times  daily., Disp: 30 g, Rfl: 0  Review of Symptoms: Pertinent positives as per HPI.  Physical Exam: Deferred given limitations of phone visit.  Laboratory & Radiologic Studies: CT C/A/P on 05/04/22: 1. Stable 4 mm right upper lobe pulmonary nodule. No new pulmonary nodule. 2. No definite finding of metastasis or recurrence in a patient with history cervical cancer status post hysterectomy. 3. No acute intrathoracic intra-abdominal, intrapelvic abnormality. 4. Infraumbilical ventral wall hernia containing a long loop of small bowel. Umbilical ventral wall hernia containing fat. No findings to suggest ischemia or associated bowel obstruction  Assessment & Plan: Tina Decker is a 51 y.o. woman with early-stage leiomyosarcoma of the uterus treated with definitive surgery in 07/2020 presenting for phone visit surveillance.  Patient is overall doing well, asymptomatic.  Recent imaging was negative for any evidence of recurrent disease.  Stable right upper lobe pulmonary nodule.  Patient has become somewhat more symptomatic with her low regards to her hernia but denies any real pain.  I encouraged her to get scheduled with a general surgeon in [place] if she  is interested in surgery.  She will be transferring her [facility] care to [facility].  I have asked her to let my office know if she would like [place] to send a referral to [facility]  I discussed the assessment and treatment plan with the patient. The patient was provided with an opportunity to ask questions and all were answered. The patient agreed with the plan and demonstrated an understanding of the instructions.   The patient was advised to call back or see an in-person evaluation if the symptoms worsen or if the condition fails to improve as anticipated.   8 minutes of total time was spent for this patient encounter, including preparation, phone counseling with the patient and coordination of care, and documentation of the encounter.   Jeral Pinch, MD  Division of Gynecologic Oncology  Department of Obstetrics and Gynecology  [facility]

## 2022-05-19 ENCOUNTER — Other Ambulatory Visit (HOSPITAL_COMMUNITY): Payer: Self-pay

## 2022-05-19 ENCOUNTER — Other Ambulatory Visit: Payer: Self-pay

## 2022-06-07 ENCOUNTER — Ambulatory Visit (HOSPITAL_COMMUNITY): Payer: 59

## 2022-07-22 ENCOUNTER — Other Ambulatory Visit (HOSPITAL_COMMUNITY): Payer: Self-pay

## 2023-02-23 IMAGING — CT CT ABD-PELV W/ CM
3 of 5 series · 14 of 36 positions shown, 17 images · IV contrast (OMNIPAQUE)
Comparison: CT chest 09/10/2020 and MR pelvis 07/11/2020.

CLINICAL DATA: Lung nodule uterine cancer.

EXAM:
CT CHEST, ABDOMEN, AND PELVIS WITH CONTRAST
TECHNIQUE: Multidetector CT imaging of the chest, abdomen and pelvis was
performed following the standard protocol during bolus
administration of intravenous contrast.
CONTRAST:  75mL OMNIPAQUE IOHEXOL 350 MG/ML SOLN

[Series 2: cap with · axial · 0.75mm/px · z∈[-625,-90]mm · 9 of 135 slices shown, 12 images]
[im 14/135  mediastinal]
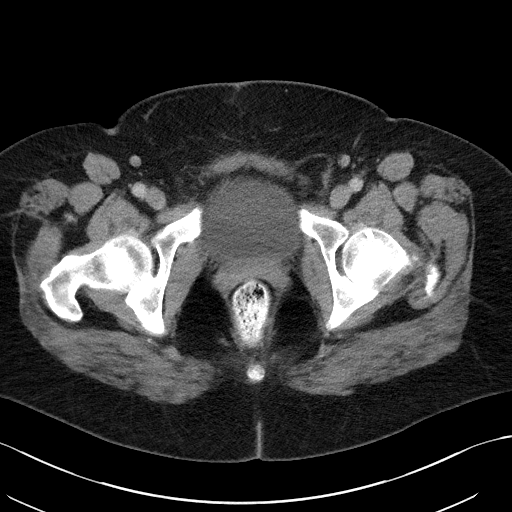
[im 14/135  lung]
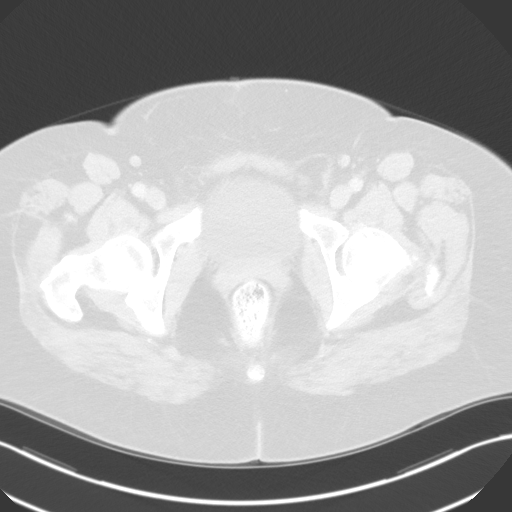
[im 27/135  lung]
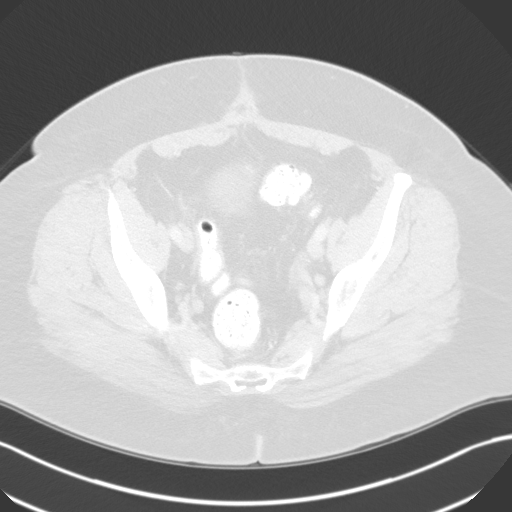
[im 41/135  lung]
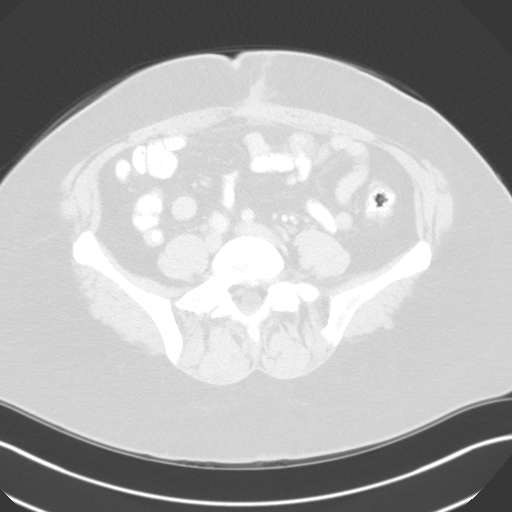
[im 54/135  lung]
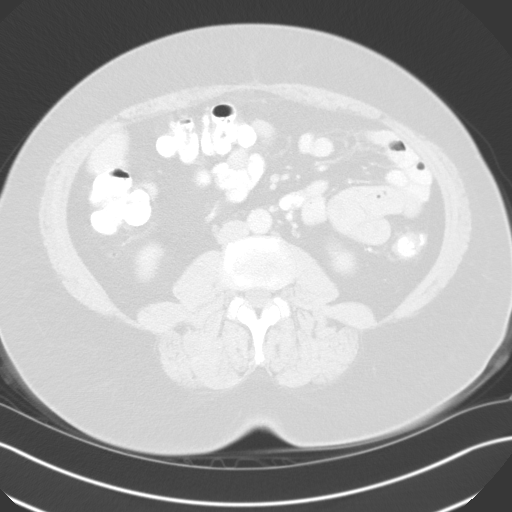
[im 68/135  mediastinal]
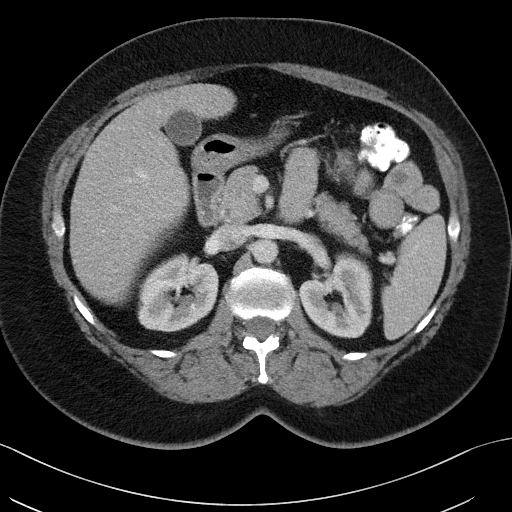
[im 68/135  lung]
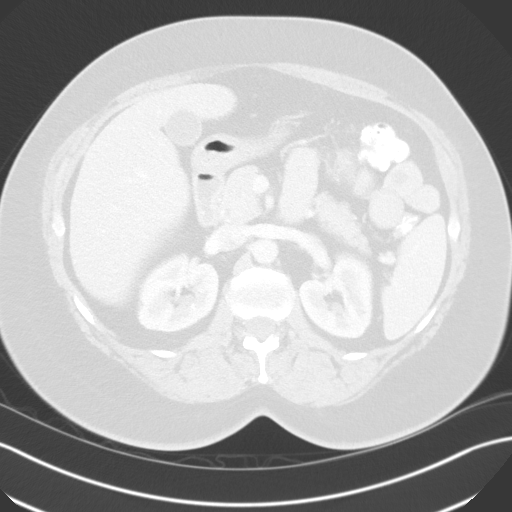
[im 81/135  lung]
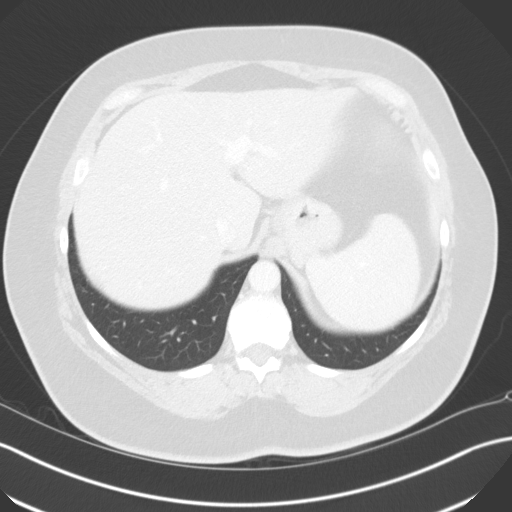
[im 94/135  lung]
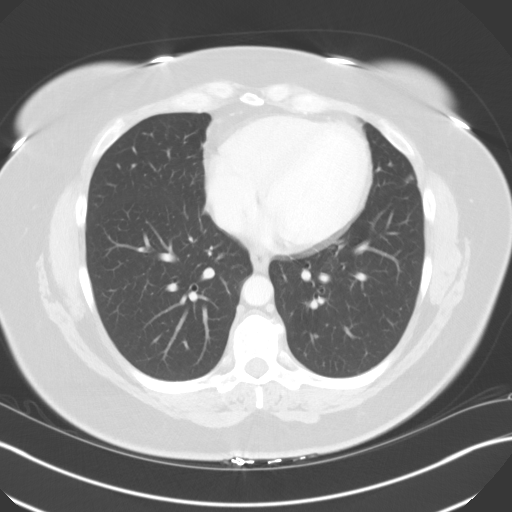
[im 108/135  lung]
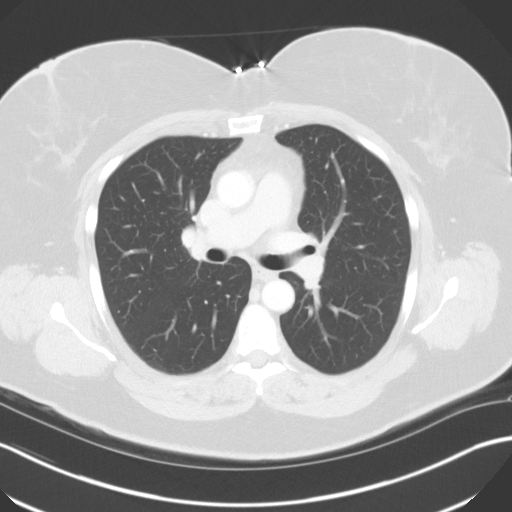
[im 121/135  mediastinal]
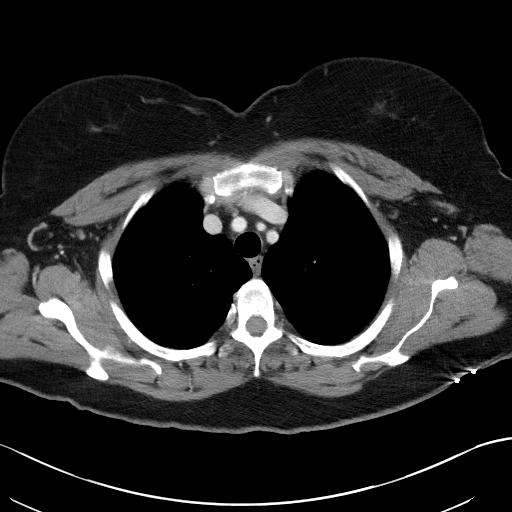
[im 121/135  lung]
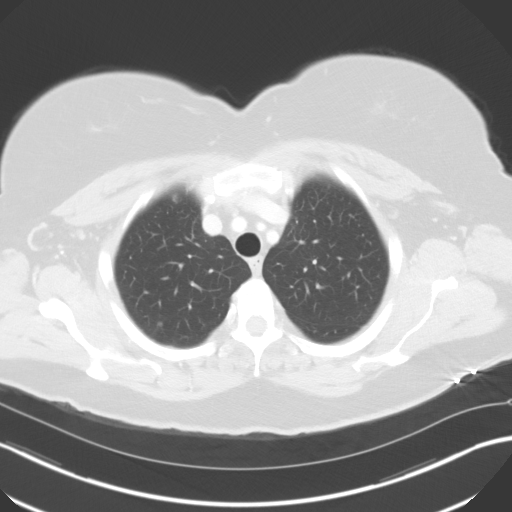

[Series 4: coronals · coronal · 0.89mm/px · 3 of 159 slices shown]
[im 32/159  lung]
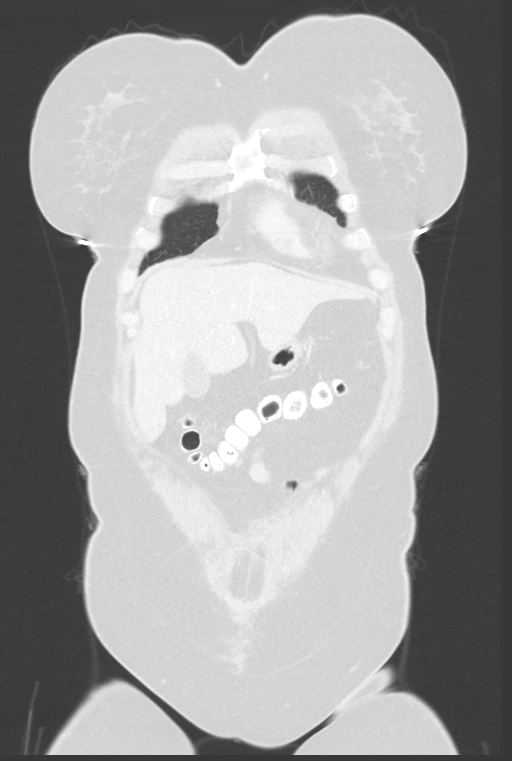
[im 64/159  lung]
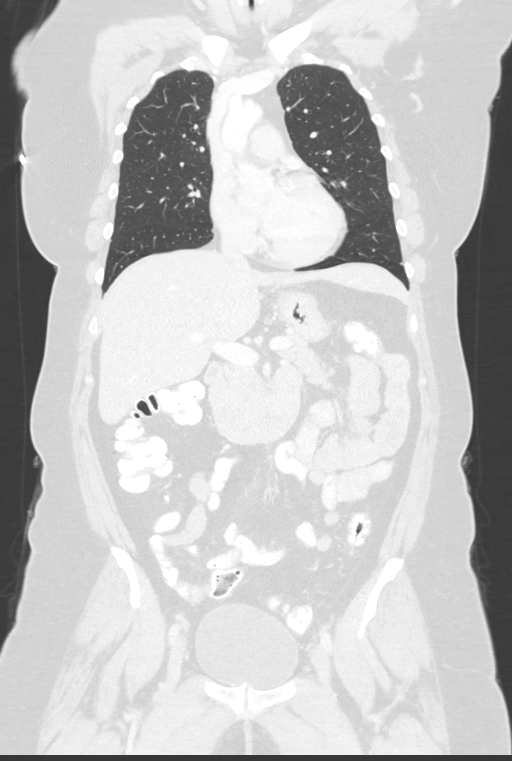
[im 95/159  lung]
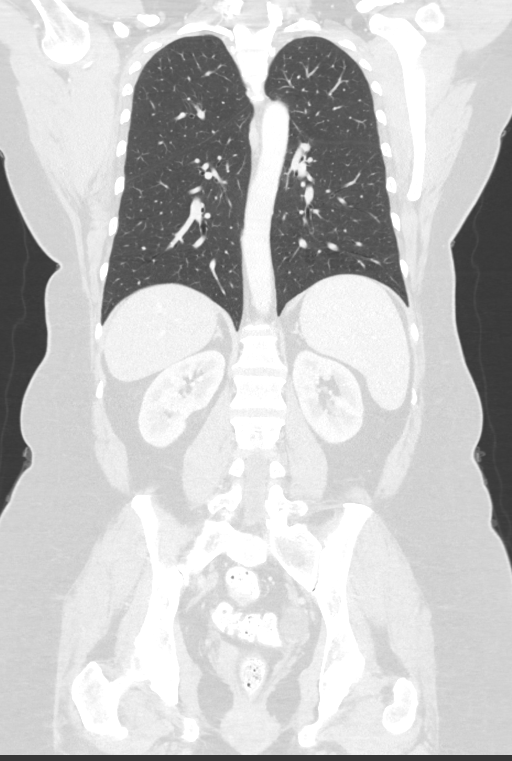

[Series 6: lung · axial · 0.75mm/px · z∈[-324,-274]mm · 2 of 165 slices shown]
[im 13/165  lung]
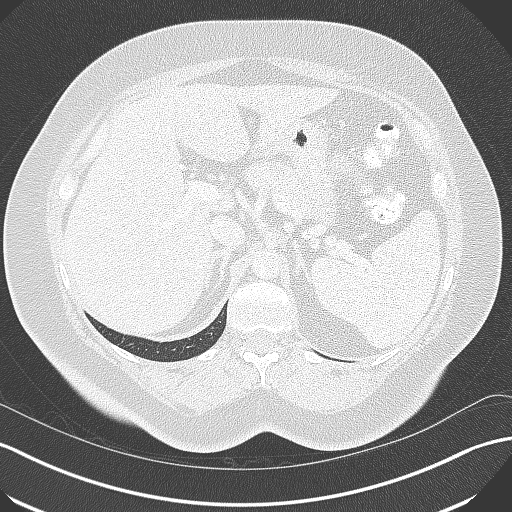
[im 38/165  lung]
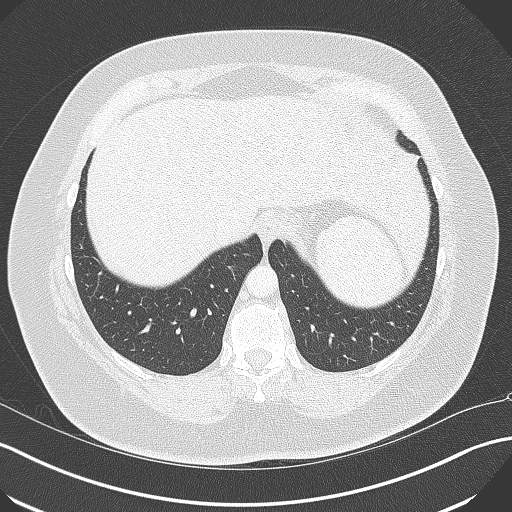

[14 of 36 positions shown; findings below may reference images not displayed]

FINDINGS: CT CHEST FINDINGS

Cardiovascular: Atherosclerotic calcification of the aorta. Heart is
at the upper limits of normal in size. No pericardial effusion.

Mediastinum/Nodes: No pathologically enlarged mediastinal, hilar or
axillary lymph nodes. Surgical clips in the left axilla. Esophagus
is unremarkable.

Lungs/Pleura: A few scattered pulmonary nodules measure up to 4 mm
in the apical segment right upper lobe, unchanged from 09/10/2020.
No pleural fluid. Airway is unremarkable.

Musculoskeletal: Degenerative changes in the spine. No worrisome
lytic or sclerotic lesions.

CT ABDOMEN PELVIS FINDINGS

Hepatobiliary: Liver and gallbladder are unremarkable. No biliary
ductal dilatation.

Pancreas: Negative.

Spleen: Negative.

Adrenals/Urinary Tract: Adrenal glands are unremarkable.
Subcentimeter low-attenuation lesion in the right kidney is too
small to characterize. Kidneys are otherwise unremarkable. Ureters
are decompressed. Bladder is grossly unremarkable.

Stomach/Bowel: Stomach, small bowel, appendix and colon are
unremarkable.

Vascular/Lymphatic: Vascular structures are unremarkable. No
pathologically enlarged lymph nodes.

Reproductive: Hysterectomy. Fluid density 2.7 cm left adnexal
lesion, similar to 07/11/2020 and likely a physiologic cyst in a
patient of this age.

Other: No free fluid.  Mesenteries and peritoneum are unremarkable.

Musculoskeletal: No worrisome lytic or sclerotic lesions.
IMPRESSION: 1. No definitive evidence of metastatic disease.
2. Scattered pulmonary nodules are unchanged from 09/10/2020.
Continued attention on follow-up is recommended.
3.  Aortic atherosclerosis ([id number].A).

## 2023-08-04 IMAGING — CT CT CHEST-ABD-PELV W/ CM
2 of 5 series · 13 of 36 positions shown, 15 images · IV contrast (APPLIED)
Comparison: Multiple priors including most recent CT January 07, 2021.

CLINICAL DATA: History of uterine/cervical cancer, restaging

EXAM:
CT CHEST, ABDOMEN, AND PELVIS WITH CONTRAST
TECHNIQUE: Multidetector CT imaging of the chest, abdomen and pelvis was
performed following the standard protocol during bolus
administration of intravenous contrast.

[Series 2: cap with · axial · 0.80mm/px · z∈[-644,-89]mm · 10 of 137 slices shown, 12 images]
[im 13/137  mediastinal]
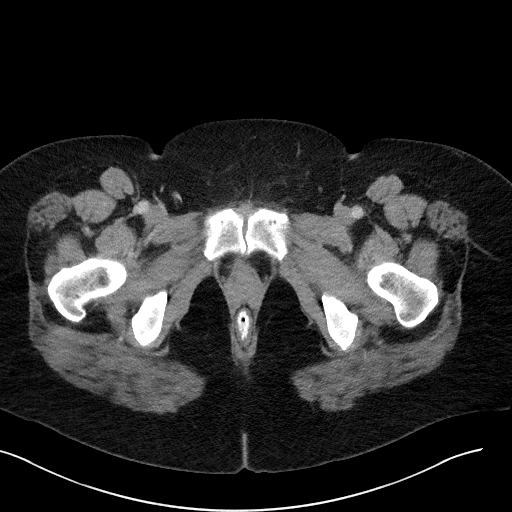
[im 13/137  bone]
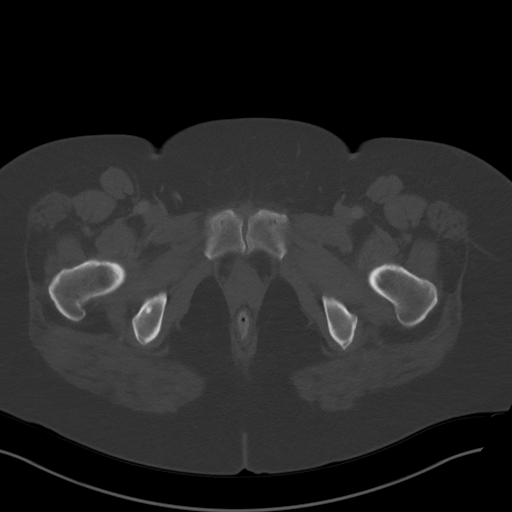
[im 25/137  mediastinal]
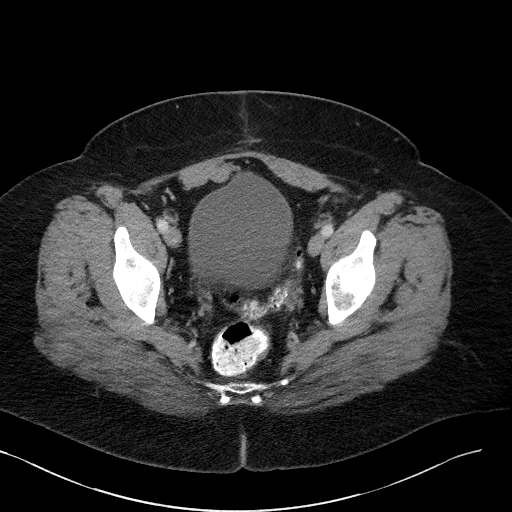
[im 38/137  mediastinal]
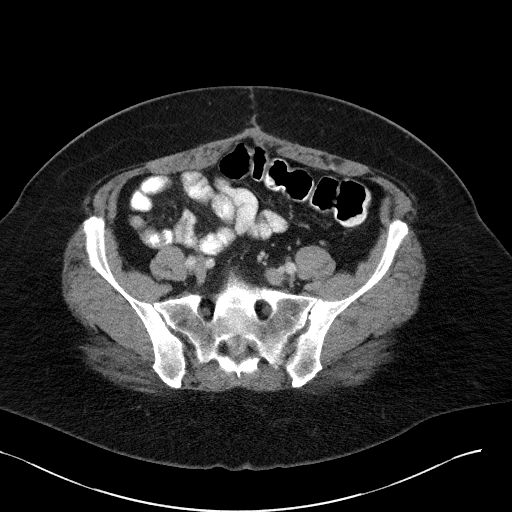
[im 50/137  mediastinal]
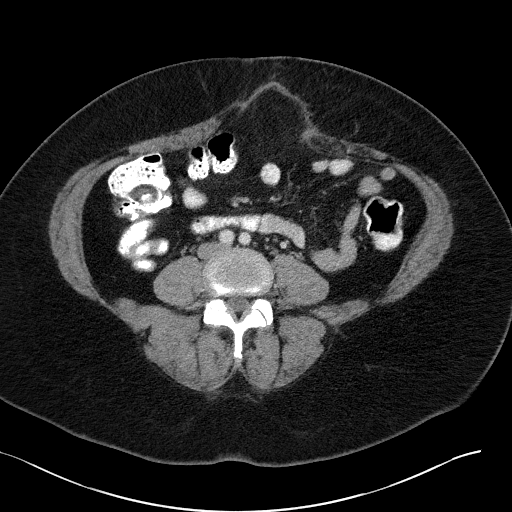
[im 62/137  mediastinal]
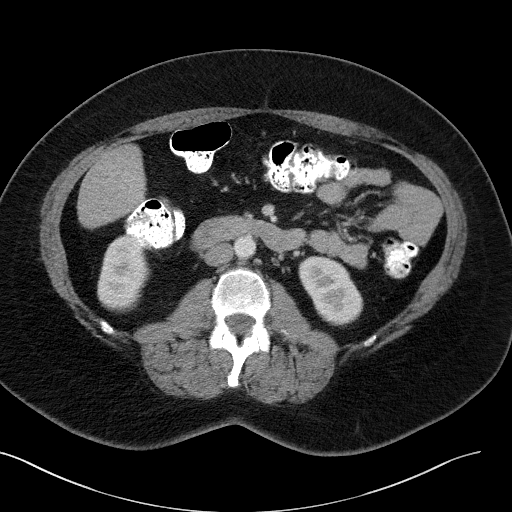
[im 75/137  mediastinal]
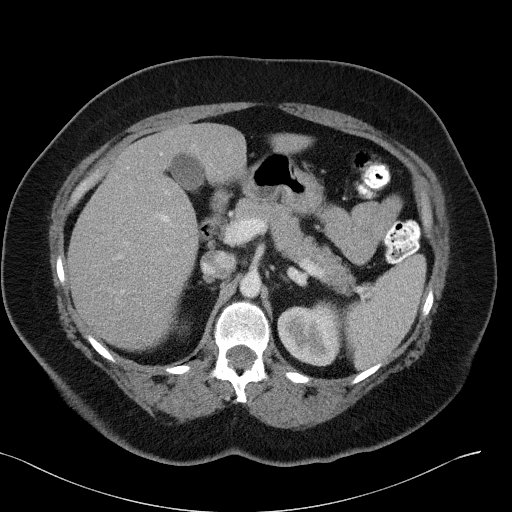
[im 87/137  mediastinal]
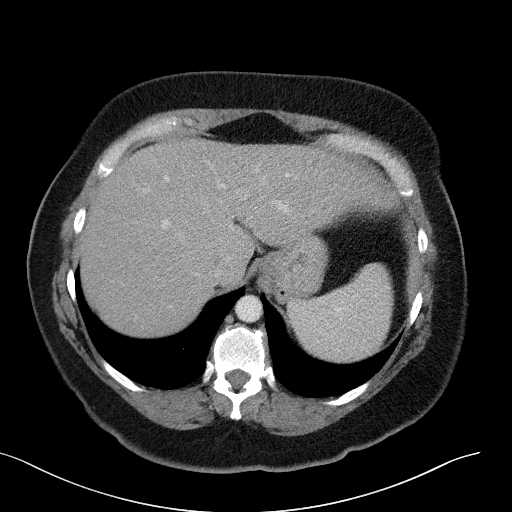
[im 99/137  mediastinal]
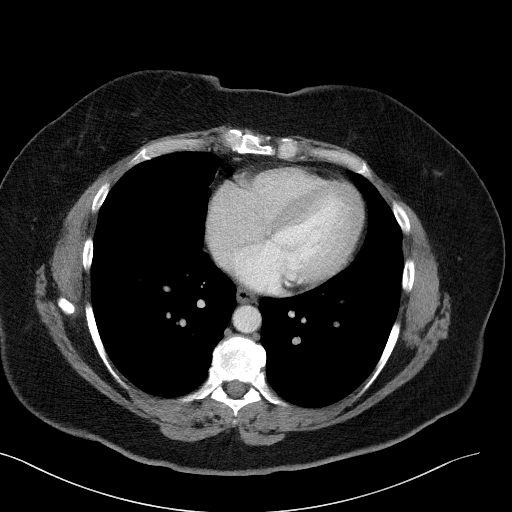
[im 112/137  mediastinal]
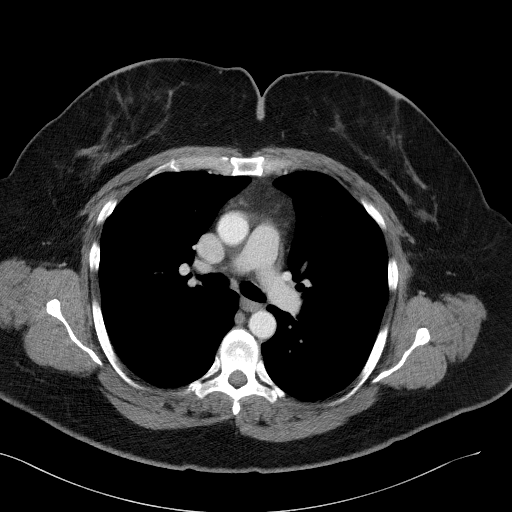
[im 112/137  bone]
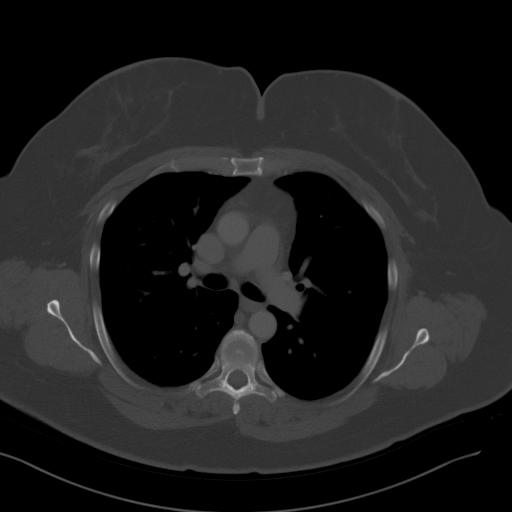
[im 124/137  mediastinal]
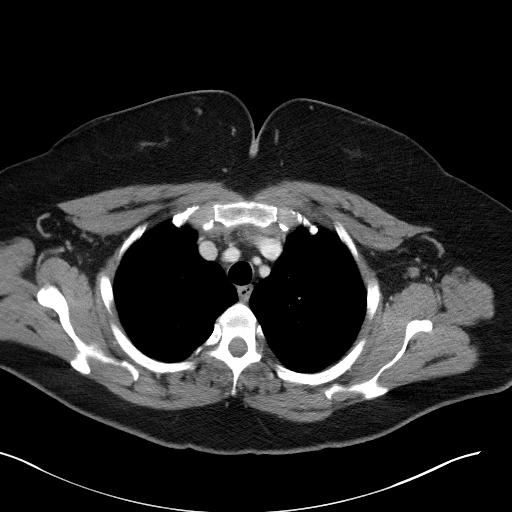

[Series 5: coronals · coronal · 0.87mm/px · 3 of 158 slices shown]
[im 32/158  mediastinal]
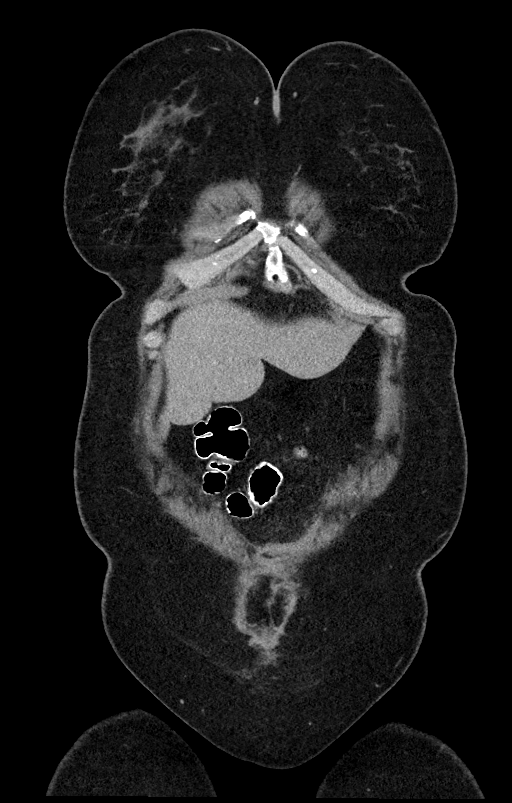
[im 63/158  mediastinal]
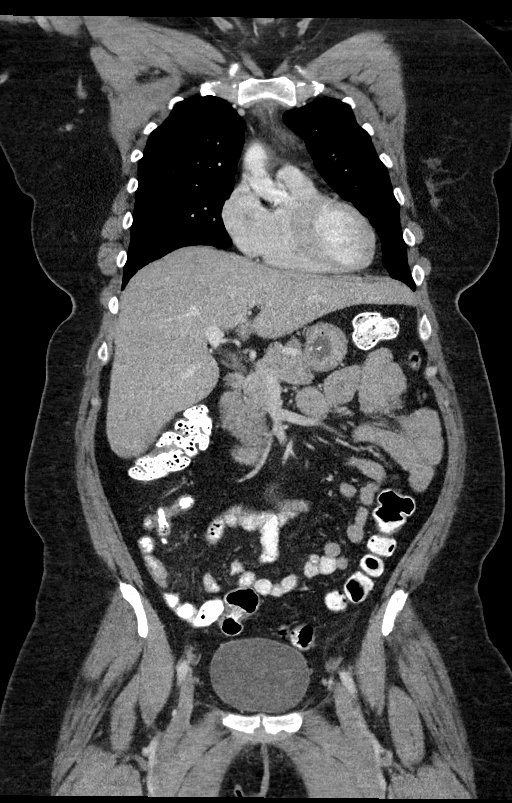
[im 95/158  mediastinal]
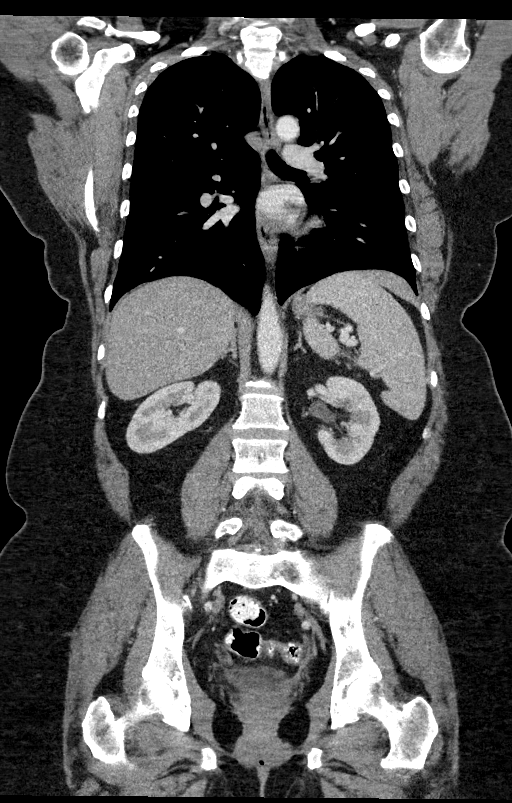

[13 of 36 positions shown; findings below may reference images not displayed]

RADIATION DOSE REDUCTION: This exam was performed according to the
departmental dose-optimization program which includes automated
exposure control, adjustment of the mA and/or kV according to
patient size and/or use of iterative reconstruction technique.

CONTRAST:  100mL OMNIPAQUE IOHEXOL 300 MG/ML  SOLN
FINDINGS: CT CHEST FINDINGS

Cardiovascular: Aortic atherosclerosis without aneurysmal dilation.
No central pulmonary embolus on this nondedicated study. Borderline
cardiac enlargement. No significant pericardial effusion/thickening.

Mediastinum/Nodes: No supraclavicular adenopathy. No discrete
thyroid nodule. No pathologically enlarged mediastinal, hilar or
axillary lymph nodes. Surgical clips in the left axilla. Esophagus
is grossly unremarkable.

Lungs/Pleura: Diffuse scattered pulmonary nodules measuring up to 4
mm in the apical segment of the right upper lobe on image 37/4 are
unchanged from prior and stable dating back to 09/10/2020 no new
suspicious pulmonary nodules or masses. No pleural effusion. No
pneumothorax. No focal airspace consolidation.

Musculoskeletal: No chest wall mass or suspicious bone lesions
identified.

CT ABDOMEN PELVIS FINDINGS

Hepatobiliary: No suspicious hepatic lesion. Gallbladder is
unremarkable. No biliary ductal dilation.

Pancreas: No pancreatic ductal dilation or evidence of acute
inflammation.

Spleen: Normal in size without focal abnormality.

Adrenals/Urinary Tract: Bilateral adrenal glands appear normal. No
hydronephrosis. Kidneys demonstrate symmetric enhancement and
excretion of contrast material. No suspicious filling defect
visualized within the opacified portions of the collecting systems
or ureters on delayed imaging. Tiny hypodense right interpolar renal
lesion on image [DATE] is technically too small to accurately
characterize but stable from prior and statistically likely reflect
cysts. No solid enhancing renal mass. Urinary bladder is
unremarkable for degree of distension.

Stomach/Bowel: Radiopaque enteric contrast material traverses the
rectum. Stomach is minimally distended without wall thickening. No
pathologic dilation small or large bowel. The appendix and terminal
ileum appear. No evidence of acute bowel inflammation.

Vascular/Lymphatic: No abdominal aortic aneurysm. No pathologically
enlarged abdominal or pelvic lymph nodes.

Reproductive: Uterus is surgically absent. No enhancing soft tissue
nodularity along the vaginal cuff. No suspicious adnexal lesion.

Other: Postsurgical change in the anterior abdominal wall. No
discrete omental or peritoneal nodularity. No abdominopelvic free
fluid.

Musculoskeletal: Mild spondylosis. No aggressive lytic or blastic
lesion of bone. High density sclerotic lesions in the right superior
pubic ramus, right acetabulum and left femoral head are stable from
prior and likely reflect benign bone islands. No aggressive lytic or
blastic lesion of bone.
IMPRESSION: 1. No convincing evidence of recurrent or metastatic disease within
the chest, abdomen, or pelvis.
2. Stable small bilateral pulmonary nodules dating back to
09/10/2020, continued attention on follow-up imaging is suggested.
3.  Aortic Atherosclerosis ([id number]).

## 2023-10-21 ENCOUNTER — Other Ambulatory Visit: Payer: Self-pay
